# Patient Record
Sex: Female | Born: 1970 | Race: White | Hispanic: No | Marital: Married | Smoking: Former smoker
Health system: Southern US, Community
[De-identification: ages and names within clinical notes are randomized; demographics above are authoritative.]

## PROBLEM LIST (undated history)

## (undated) ENCOUNTER — Emergency Department (HOSPITAL_COMMUNITY): Admission: EM | Payer: BLUE CROSS/BLUE SHIELD | Source: Home / Self Care

## (undated) DIAGNOSIS — K219 Gastro-esophageal reflux disease without esophagitis: Secondary | ICD-10-CM

## (undated) DIAGNOSIS — T8859XA Other complications of anesthesia, initial encounter: Secondary | ICD-10-CM

## (undated) DIAGNOSIS — Z8489 Family history of other specified conditions: Secondary | ICD-10-CM

## (undated) DIAGNOSIS — J189 Pneumonia, unspecified organism: Secondary | ICD-10-CM

## (undated) DIAGNOSIS — E119 Type 2 diabetes mellitus without complications: Secondary | ICD-10-CM

## (undated) DIAGNOSIS — R112 Nausea with vomiting, unspecified: Secondary | ICD-10-CM

## (undated) DIAGNOSIS — Z8719 Personal history of other diseases of the digestive system: Secondary | ICD-10-CM

## (undated) DIAGNOSIS — Z9889 Other specified postprocedural states: Secondary | ICD-10-CM

## (undated) DIAGNOSIS — I10 Essential (primary) hypertension: Secondary | ICD-10-CM

## (undated) DIAGNOSIS — T4145XA Adverse effect of unspecified anesthetic, initial encounter: Secondary | ICD-10-CM

## (undated) HISTORY — PX: BREAST EXCISIONAL BIOPSY: SUR124

---
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
## 1999-01-22 ENCOUNTER — Encounter: Payer: Self-pay | Admitting: Obstetrics and Gynecology

## 1999-01-22 ENCOUNTER — Ambulatory Visit (HOSPITAL_COMMUNITY): Admission: RE | Admit: 1999-01-22 | Discharge: 1999-01-22 | Payer: Self-pay | Admitting: Obstetrics and Gynecology

## 1999-04-01 ENCOUNTER — Ambulatory Visit (HOSPITAL_COMMUNITY): Admission: RE | Admit: 1999-04-01 | Discharge: 1999-04-01 | Payer: Self-pay | Admitting: Obstetrics and Gynecology

## 1999-04-01 ENCOUNTER — Encounter: Payer: Self-pay | Admitting: Obstetrics and Gynecology

## 1999-07-08 ENCOUNTER — Encounter: Admission: RE | Admit: 1999-07-08 | Discharge: 1999-07-08 | Payer: Self-pay | Admitting: General Surgery

## 1999-07-08 ENCOUNTER — Encounter: Payer: Self-pay | Admitting: General Surgery

## 1999-07-17 ENCOUNTER — Other Ambulatory Visit: Admission: RE | Admit: 1999-07-17 | Discharge: 1999-07-17 | Payer: Self-pay | Admitting: General Surgery

## 2000-03-29 ENCOUNTER — Other Ambulatory Visit: Admission: RE | Admit: 2000-03-29 | Discharge: 2000-03-29 | Payer: Self-pay | Admitting: Obstetrics and Gynecology

## 2001-05-01 ENCOUNTER — Other Ambulatory Visit: Admission: RE | Admit: 2001-05-01 | Discharge: 2001-05-01 | Payer: Self-pay | Admitting: Obstetrics and Gynecology

## 2002-05-01 ENCOUNTER — Other Ambulatory Visit: Admission: RE | Admit: 2002-05-01 | Discharge: 2002-05-01 | Payer: Self-pay | Admitting: Obstetrics and Gynecology

## 2002-06-14 ENCOUNTER — Encounter: Payer: Self-pay | Admitting: Obstetrics and Gynecology

## 2002-06-14 ENCOUNTER — Ambulatory Visit (HOSPITAL_COMMUNITY): Admission: RE | Admit: 2002-06-14 | Discharge: 2002-06-14 | Payer: Self-pay | Admitting: Obstetrics and Gynecology

## 2002-08-21 ENCOUNTER — Ambulatory Visit (HOSPITAL_COMMUNITY): Admission: RE | Admit: 2002-08-21 | Discharge: 2002-08-21 | Payer: Self-pay | Admitting: Obstetrics and Gynecology

## 2002-08-21 ENCOUNTER — Encounter: Payer: Self-pay | Admitting: Obstetrics and Gynecology

## 2003-02-23 HISTORY — PX: BREAST SURGERY: SHX581

## 2003-03-19 ENCOUNTER — Encounter: Admission: RE | Admit: 2003-03-19 | Discharge: 2003-03-19 | Payer: Self-pay | Admitting: Obstetrics and Gynecology

## 2003-04-02 ENCOUNTER — Ambulatory Visit (HOSPITAL_COMMUNITY): Admission: RE | Admit: 2003-04-02 | Discharge: 2003-04-02 | Payer: Self-pay | Admitting: Obstetrics and Gynecology

## 2003-05-02 ENCOUNTER — Encounter (INDEPENDENT_AMBULATORY_CARE_PROVIDER_SITE_OTHER): Payer: Self-pay | Admitting: *Deleted

## 2003-05-02 ENCOUNTER — Inpatient Hospital Stay (HOSPITAL_COMMUNITY): Admission: AD | Admit: 2003-05-02 | Discharge: 2003-05-05 | Payer: Self-pay | Admitting: Obstetrics and Gynecology

## 2003-06-13 ENCOUNTER — Other Ambulatory Visit: Admission: RE | Admit: 2003-06-13 | Discharge: 2003-06-13 | Payer: Self-pay | Admitting: Obstetrics and Gynecology

## 2004-06-15 ENCOUNTER — Other Ambulatory Visit: Admission: RE | Admit: 2004-06-15 | Discharge: 2004-06-15 | Payer: Self-pay | Admitting: Obstetrics and Gynecology

## 2004-07-09 ENCOUNTER — Emergency Department (HOSPITAL_COMMUNITY): Admission: EM | Admit: 2004-07-09 | Discharge: 2004-07-09 | Payer: Self-pay | Admitting: Emergency Medicine

## 2004-10-13 ENCOUNTER — Encounter: Admission: RE | Admit: 2004-10-13 | Discharge: 2004-10-13 | Payer: Self-pay | Admitting: General Surgery

## 2005-02-22 HISTORY — PX: CHOLECYSTECTOMY: SHX55

## 2007-02-27 ENCOUNTER — Observation Stay (HOSPITAL_COMMUNITY): Admission: AD | Admit: 2007-02-27 | Discharge: 2007-02-28 | Payer: Self-pay | Admitting: Obstetrics and Gynecology

## 2007-03-02 ENCOUNTER — Inpatient Hospital Stay (HOSPITAL_COMMUNITY): Admission: AD | Admit: 2007-03-02 | Discharge: 2007-03-02 | Payer: Self-pay | Admitting: Obstetrics and Gynecology

## 2007-03-16 ENCOUNTER — Inpatient Hospital Stay (HOSPITAL_COMMUNITY): Admission: AD | Admit: 2007-03-16 | Discharge: 2007-03-19 | Payer: Self-pay | Admitting: Obstetrics and Gynecology

## 2007-03-16 ENCOUNTER — Encounter (INDEPENDENT_AMBULATORY_CARE_PROVIDER_SITE_OTHER): Payer: Self-pay | Admitting: Obstetrics and Gynecology

## 2007-10-03 ENCOUNTER — Encounter: Admission: RE | Admit: 2007-10-03 | Discharge: 2007-10-03 | Payer: Self-pay | Admitting: Orthopedic Surgery

## 2008-11-23 ENCOUNTER — Encounter: Admission: RE | Admit: 2008-11-23 | Discharge: 2008-11-23 | Payer: Self-pay | Admitting: Sports Medicine

## 2008-11-26 ENCOUNTER — Encounter: Admission: RE | Admit: 2008-11-26 | Discharge: 2008-11-26 | Payer: Self-pay | Admitting: Sports Medicine

## 2008-12-10 ENCOUNTER — Encounter: Admission: RE | Admit: 2008-12-10 | Discharge: 2008-12-10 | Payer: Self-pay | Admitting: Sports Medicine

## 2009-01-24 ENCOUNTER — Encounter: Admission: RE | Admit: 2009-01-24 | Discharge: 2009-01-24 | Payer: Self-pay | Admitting: Internal Medicine

## 2009-02-22 HISTORY — PX: BACK SURGERY: SHX140

## 2009-03-11 ENCOUNTER — Encounter: Admission: RE | Admit: 2009-03-11 | Discharge: 2009-03-11 | Payer: Self-pay | Admitting: Sports Medicine

## 2009-03-23 ENCOUNTER — Encounter: Admission: RE | Admit: 2009-03-23 | Discharge: 2009-03-23 | Payer: Self-pay | Admitting: Neurological Surgery

## 2010-03-15 ENCOUNTER — Encounter: Payer: Self-pay | Admitting: Obstetrics and Gynecology

## 2010-03-15 ENCOUNTER — Encounter: Payer: Self-pay | Admitting: Internal Medicine

## 2010-07-07 NOTE — Discharge Summary (Signed)
NAME:  Melanie Peters, Melanie Peters              ACCOUNT NO.:  [id number]   MEDICAL RECORD NO.:  [id number]          PATIENT TYPE:  INP   LOCATION:  9139                          FACILITY:  [facility]   PHYSICIAN:  Zenaida Niece, M.D.DATE OF BIRTH:  1970/05/13   DATE OF ADMISSION:  03/16/2007  DATE OF DISCHARGE:  03/19/2007                               DISCHARGE SUMMARY   ADMISSION DIAGNOSIS:  Intrauterine pregnancy at 37+ weeks, previous  cesarean section gestational diabetes, desires surgical sterility, in  labor with rupture of membranes.   DISCHARGE DIAGNOSES:  Intrauterine pregnancy at 37+ weeks, previous  cesarean section gestational diabetes, desires surgical sterility, in  labor with rupture of membranes.   PROCEDURES:  On January 22, she had a repeat low transverse cesarean  section and bilateral partial salpingectomy.   COMPLICATIONS:  None.   HISTORY AND PHYSICAL:  Please see chart for full history and physical,  but briefly this is a 40 year old white female gravida 3, para 1-0-1-1  with an EGA of [redacted] weeks, who presents with complaint of regular  contractions and leaking fluid.  Prenatal care complicated by  gestational diabetes, eventually controlled with 90 units of Lantus  insulin daily.  She has had reactive nonstress test.  She has also had a  prior cesarean section.   PAST MEDICAL HISTORY:  Significant for gastroesophageal reflux disorder  and remote history of seizures.   PAST SURGICAL HISTORY:  Cesarean section, surgery for TMJ, a left breast  cyst, right breast reduction, and cholecystectomy.   PHYSICAL EXAM:  VITAL SIGNS:  She is afebrile with stable vital signs.  Blood pressure is 140/85.  ABDOMEN:  Gravid, nontender with well-healed transverse scar.  Cervix is  2, 90, -2 vertex presentation, and she is grossly ruptured.   HOSPITAL COURSE:  The patient was admitted on the day of surgery and  underwent repeat low transverse cesarean section and tubal ligation  under  spinal anesthesia without complications.  Estimated blood loss was  800 mL.  The night of surgery, she had difficulty sitting up and was  unable to stand.  She was hemodynamically stable and had good urine  output.  Preoperative hemoglobin 11.4 and hemoglobin on the afternoon of  surgery 9.4, and on postpartum postoperative number #1 was 8.8.  CBGs  remained all normal, after delivery without insulin.  She was able to  ambulate on postoperative number one and had no further complications.  Hemoglobin on postoperative #2 was up to 9.6.  On postoperative #3, she  was felt to be stable enough for discharge home.   DISCHARGE INSTRUCTIONS:  Regular diet, pelvic rest, no strenuous  activity.  Followup is in 3 to 4 days for staple removal.   MEDICATIONS:  1. Percocet #40, one to two p.o. q. 4-6 hours p.r.n. pain and over-the-      counter ibuprofen as needed, and she is given our discharge      pamphlet.      Zenaida Niece, M.D.  Electronically Signed     TDM/MEDQ  D:  03/19/2007  T:  03/19/2007  Job:  [id number]

## 2010-07-07 NOTE — Op Note (Signed)
NAME:  Melanie Peters, Melanie Peters              ACCOUNT NO.:  [id number]   MEDICAL RECORD NO.:  [id number]          PATIENT TYPE:  INP   LOCATION:  9139                          [facility]ACILITY:  [facility]   PHYSICIAN:  Leighton Roach Meisinger, M.D.DATE OF BIRTH:  04-18-1970   DATE OF PROCEDURE:  03/16/2007  DATE OF DISCHARGE:                               OPERATIVE REPORT   PREOPERATIVE DIAGNOSES:  Intrauterine pregnancy at 37+ weeks, previous  cesarean section, gestational diabetes, desires surgical sterility, in  labor with rupture of membranes.   POSTOPERATIVE DIAGNOSES:  Intrauterine pregnancy at 37+ weeks, previous  cesarean section, gestational diabetes, desires surgical sterility, in  labor with rupture of membranes.   PROCEDURE:  Repeat low transverse cesarean section and bilateral partial  salpingectomy.   SURGEON:  Zenaida Niece, M.D.   ASSISTANT:  Malachi Pro. Ambrose Mantle, M.D.   ANESTHESIA:  Spinal.   FINDINGS:  She had normal gravid anatomy with normal tubes and ovaries.  She delivered a viable female infant with Apgars of 8 and 8 that weighed 8  pounds 8 ounces and had nuchal cord x2.   SPECIMENS:  Placenta sent to labor and delivery and segments of  bilateral fallopian tubes sent to pathology.   ESTIMATED BLOOD LOSS:  800 mL.   COMPLICATIONS:  None.   PROCEDURE IN DETAIL:  The patient was taken to the operating room and  placed in the sitting position.  Dr. Malen Gauze instilled spinal anesthesia.  She was then placed in the dorsal supine position with left lateral  tilt.  Abdomen was prepped and draped in the usual sterile fashion and a  Foley catheter was inserted.  The level of her anesthesia was found to  be adequate.  Abdomen was then entered via a standard Pfannenstiel  incision through her previous scar.  She was noted to have a significant  diastasis recti.  The rectus muscles were not needed to be separated at  all to enter the peritoneal cavity.  The Alexis disposable self-  retaining  retractor was placed and good visualization was achieved.  A 4  cm transverse incision was then made in the lower uterine segment,  pushing the bladder inferior.  The lower uterine segment was fairly  thick.  Once the uterine cavity was entered the incision was extended  digitally.  The fetal vertex was grasped and delivered through the  incision atraumatically.  Mouth and nares were suctioned and nuchal cord  x2 was reduced.  The remainder of the infant then delivered  atraumatically.  Cord was doubly clamped and cut and the infant handed  to the awaiting pediatric team.  Placenta was then manually removed.  Uterus was wiped dry with a clean lap pad and all clots and debris  removed.  Uterine incision was inspected and found to be free of  extensions.  The uterine incision was then closed in 1 layer, being a  running locking layer with #1 chromic with adequate hemostasis.  A  second suture from the right side was used to tie the suture that  started from the left side and came across the uterus.  Bleeding from  serosal edges was controlled with electrocautery.   Attention was turned to tubal ligation.  Both fallopian tubes were  identified and traced to their fimbriated ends.  A Babcock clamp was  used to grab the middle portion of each segment.  A window was made in  an avascular portion of the mesosalpinx.  Zero plain gut suture was  passed through this window and tied proximally and then wrapped around  distally to form a knuckle of tube.  The knuckle of tube was then  removed sharply.  On both sides both tubal ostia were identified and the  stumps were hemostatic.  Prior to making the uterine incision, a moist  lap pad had been placed into the peritoneal cavity on the right side to  prevent prolapse of bowel and omentum.  Uterine incision was again now  inspected and found to be hemostatic and this lap pad was removed.  The  disposable retractor was removed.  Subfascial space was  irrigated and  made hemostatic with electrocautery.  Rectus muscles were reapproximated  in the midline with running #1 chromic.  The fascia was then closed in a  running fashion starting at both ends and meeting in the middle with 0  Vicryl.  Subcutaneous tissue was then irrigated and made hemostatic with  electrocautery.  Subcutaneous tissue was closed with running 2-0 plain  gut suture.  Skin was then closed with staples followed by a sterile  dressing.  The patient tolerated the procedure well.  She was taken to  the recovery room in stable condition after tolerating the procedure  well.  Counts were correct and she received Ancef 1 g IV at the  beginning of the procedure.      Zenaida Niece, M.D.  Electronically Signed     TDM/MEDQ  D:  03/16/2007  T:  03/16/2007  Job:  [id number]

## 2010-07-07 NOTE — H&P (Signed)
NAME:  Melanie Peters, Melanie Peters              ACCOUNT NO.:  [id number]   MEDICAL RECORD NO.:  [id number]          PATIENT TYPE:  INP   LOCATION:  9139                          [facility]ACILITY:  [facility]   PHYSICIAN:  Malachi Pro. Ambrose Mantle, M.D. DATE OF BIRTH:  08-02-1970   DATE OF ADMISSION:  03/16/2007  DATE OF DISCHARGE:                              HISTORY & PHYSICAL   HISTORY OF PRESENT ILLNESS:  This is a 40 year old white married female,  para 1-0-1-1, gravida 3,  EDC April 02, 2007 admitted with ruptured  membranes and early labor.  Blood group and type A positive with a  negative antibody, RPR nonreactive, rubella immune, hepatitis B surface  antigen negative, HIV negative, GC and chlamydia negative.  Pap smear  normal.  Vaginal ultrasound on August 30, 2006, crown-rump length 2.53 cm,  9 weeks 2 days, [facility] April 02, 2007.  A 1-hour Glucola done at 16 weeks  was 194.  3-hour GTT fasting was 106, 1 hour 187, 2 hours 159, 3 hours  158.  AFP was negative.  First trimester screen was negative.  The  patient was begun on glyburide 2.5 mg a day in September of 2008.  The  glyburide was increased to 5 mg a day to decrease her fasting blood  sugar on January 02, 2007.  So, on January 09, 2007, the patient was  not controlled well enough with glyburide, and she was begun on Lantus  30 units every morning.  On January 16, 2007, it was increased to 40  units.  On January 23, 2007, it was increased to 45 units, and on  January 27, 2007 to 50 units.  Nonstress tests were begun twice a week.  Lantus was increased to 70 units on February 06, 2007 and was up to 80  units from February 13, 2007.  It subsequently was increased to 85 units  a day, and it seemed to be adequately controlled on that.  Her group B  strep was positive.  On March 07, 2007, the patient seemed to be well-  controlled on 90 units a day.  The patient was admitted to the hospital  because of vaginal bleeding on February 27, 2007.  The bleeding  subsided,  and she was able to be discharged on February 28, 2007.  At approximately  3:00 a.m. today, she called me stating that she had bled heavily,  passing a clot, and was still bleeding.  She came to the hospital and on  admission to the hospital actually what she showed on her pad was bloody  fluid, and rupture of membranes was confirmed.  She was contracting  every 3-4 minutes.  Her cervix was 2 cm, 90%, vertex at a -2.  The  patient did not want to have labor.   ALLERGIES:  NO KNOWN ALLERGIES.   PAST SURGICAL HISTORY:  1. She did have a C-section in March of 2005.  2. She had TMJ surgery in 1998.  3. Left breast cyst removed in 2000.  4. Right breast reduction in 1996.  5. Cholecystectomy in 2006/   PAST MEDICAL HISTORY:  1.  Gastroesophageal reflux disorder.  2. She did have seizures in the sixth grade and was on phenobarbital      for 6 years.   SOCIAL HISTORY:  No alcohol, tobacco, or drugs.   FAMILY HISTORY:  The father has high blood pressure, heart disease, and  diabetes.  Mother has lung disease.  Paternal grandfather has heart  disease and high blood pressure.  Maternal grandmother, diabetes.   PHYSICAL EXAMINATION:  GENERAL:  Well-developed obese white female  having contractions.  VITAL SIGNS:  Her blood pressure is 144/85, temperature is 97.5, pulse  67, respirations 20.  HEENT:  No cranial abnormalities.  Extraocular movements intact.  Nose  and pharynx clear.  LUNGS:  Clear to auscultation.  HEART:  Normal size and sounds.  No murmurs.  ABDOMEN:  Soft.  It is not tender.  Fundal height on March 07, 2007  was 42 cm.  Fetal heart tones are normal.  The patient is having  contractions with no decelerations.  The cervix is 2 cm, 90%, vertex at  a -2 station.  Blood sugar in the MAU was 89.   ADMITTING IMPRESSION:  1. Intrauterine pregnancy at 37+ weeks with ruptured membranes, early      labor, prior history of cesarean section, declines vaginal birth       after cesarean.  2. Gestational diabetes on insulin.   The patient is prepared for C-section.      Malachi Pro. Ambrose Mantle, M.D.  Electronically Signed     TFH/MEDQ  D:  03/16/2007  T:  03/16/2007  Job:  [id number]

## 2010-07-10 NOTE — Op Note (Signed)
NAME:  Melanie Peters, Melanie Peters                          ACCOUNT NO.:  [id number]   MEDICAL RECORD NO.:  [id number]                   PATIENT TYPE:  INP   LOCATION:  9130                                 FACILITY:  [facility]   PHYSICIAN:  Zenaida Niece, M.D.             DATE OF BIRTH:  1970-10-01   DATE OF PROCEDURE:  05/02/2003  DATE OF DISCHARGE:                                 OPERATIVE REPORT   PREOPERATIVE DIAGNOSIS:  Intrauterine pregnancy at 37 plus weeks,  gestational diabetes, arrest of dilation.   POSTOPERATIVE DIAGNOSIS:  Intrauterine pregnancy at 37 plus weeks,  gestational diabetes, arrest of dilation, myomatous uterus.   PROCEDURE:  Primary low transverse cesarean section and myomectomy.   SURGEON:  Zenaida Niece, M.D.   ANESTHESIA:  Epidural.   ESTIMATED BLOOD LOSS:  1000 mL.   FINDINGS:  The patient had normal anatomy except for a golf ball size  pedunculated anterior myoma.  She delivered a viable female infant with  Apgars 8 and 9, weight 7 pounds 8 ounces.   PROCEDURE IN DETAIL:  The patient was taken to the operating room and placed  in the dorsal supine position with a left lateral tilt.  Her previously  placed epidural was dosed appropriately.  Her abdomen was prepped and draped  in the usual sterile fashion.  A Foley catheter had previously been placed.  The level of her anesthesia was found to be adequate and her abdomen was  entered via a standard Pfannenstiel incision.  The bladder blade was placed  and the lower uterine segment was incised in a transverse fashion and the  bladder pushed inferior.  The uterine incision was extended digitally.  The  fetal vertex was grasped and delivered through the incision atraumatically.  The mouth and nares were suctioned.  The remainder of the infant delivered  atraumatically.  The cord was doubly clamped and cut and the infant handed  to the awaiting pediatric team.  Cord blood and cord gas were obtained.  The  placenta  manually removed.  The uterus was exteriorized to get good  visualization of the incision as it appeared to be bleeding briskly.  Several bleeding areas were controlled with ring forceps.  The incision was  without extensions.  The uterus was then replaced into the abdominal cavity.  The uterine incision was closed in one layer being a running locking layer  with #1 chromic with adequate hemostasis.  Both tubes and ovaries were  normal.  The anterior pedunculated myoma was located.  Verbal consent was  obtained from the patient to remove this.  The base of the fibroid was tied  with a #1 chromic free tie.  The myoma was then removed with electrocautery  and the stump was hemostatic.  The uterine incision was again inspected and  found to be hemostatic.  Bleeding from the serosal edges was controlled with  electrocautery.  The subfascial space  was irrigated and made hemostatic with  electrocautery.  The fascia was closed in a running fashion starting at both  ends and meeting in the middle with 0 Vicryl.  The subcutaneous tissue was  irrigated and made hemostatic with electrocautery.  The  subcutaneous tissue was closed with running 2-0 plain gut suture.  The skin  was closed with staples and a sterile dressing.  The patient tolerated the  procedure well and was taken to the recovery room in stable condition.  Counts were correct x 2.  She was given Ancef 1 gram after cord clamp.                                               Zenaida Niece, M.D.    TDM/MEDQ  D:  05/02/2003  T:  05/04/2003  Job:  [id number]

## 2010-07-10 NOTE — Discharge Summary (Signed)
NAME:  Melanie Peters, Melanie Peters                          ACCOUNT NO.:  [id number]   MEDICAL RECORD NO.:  [id number]                   PATIENT TYPE:  INP   LOCATION:  9130                                 [facility]ACILITY:  [facility]   PHYSICIAN:  Malachi Pro. Ambrose Mantle, M.D.              DATE OF BIRTH:  Mar 06, 1970   DATE OF ADMISSION:  05/02/2003  DATE OF DISCHARGE:  05/05/2003                                 DISCHARGE SUMMARY   HISTORY OF PRESENT ILLNESS:  A 40 year old white female para 0-0-1-0 gravida  2, 37+ weeks gestation, presented to labor and delivery with spontaneous  rupture of membranes at 1 a.m. on May 02, 2003. Clear fluid. No  significant contractions. Prenatal care complicated by gestational diabetes,  controlled on Glyburide 5 mg twice a day. Also followed with non-stress test  two times weekly. Otherwise, history of anxiety that was stable off  medications and placenta previa by ultrasound, which resolved. Blood group  and type A+, negative antibody, non-reactive serology, Rubella immune,  hepatitis B surface antigen negative. HIV negative. GC and Chlamydia  negative. Triple screen normal. Group B strep negative. One hour Glucola  178. Three hour GTT fasting 121. One hour 243. Two hour 226 and 3 hours 196.   PAST OBSTETRIC HISTORY:  Early abortion x1.   GYNECOLOGIC HISTORY:  Negative.   PAST SURGICAL HISTORY:  Breast reduction in 1996. TMJ surgery in 1988.  Lumpectomy in 2000.   PAST MEDICAL HISTORY:  Anxiety, history of seizures in the 6th grade that  resolved.   ALLERGIES:  No known drug allergies.   MEDICATIONS:  Glyburide 5 mg twice a day.   PHYSICAL EXAMINATION:  VITAL SIGNS:  The patient was afebrile with normal  vital signs on admission.  HEART:  Normal.  LUNGS:  Normal.  ABDOMEN:  Soft and gravid.  PELVIC:  Cervix 1 cm, 50%, grossly ruptured membranes. Clear fluid.   HOSPITAL COURSE:  The patient became uncomfortable after starting Pitocin.  By 8:10 a.m., the cervix was 3  cm. By 12:35 p.m., she was 8 cm. By 5:05  p.m., she was still 8 cm. She underwent a low transverse cervical cesarean  section by Dr. Jackelyn Knife with myomectomy because of arrest of dilatation.  The myomectomy was done because of a pedunculated myoma. Procedure done  under epidural block. Blood loss about 1,000 cc. There was a golf ball size  pedunculated anterior myoma. The infant was a 7 pound 8 ounce female with  Apgar's of 8 at 1 and 9 at 5 minutes. Postoperatively the patient did well.  She did continue to have somewhat elevated blood sugars. Her 1 fasting blood  sugar was 136. Her 1 hour blood sugars were 179, 172. On the third  postoperative day, she was afebrile, ambulating well without difficulty,  passing flatus, voiding well and was ready for discharge.   LABORATORY DATA:  Initial hemoglobin 11.3, hematocrit 34.1. White count  11,700. Platelet count 299,000. Followup hemoglobin 9.9. Hematocrit 29.8.  RPR was non-reactive.   FINAL DIAGNOSES:  1. Intrauterine pregnancy at 37+ weeks with premature rupture of the     membranes.  2. Delivered vertex by cesarean section.  3. Arrest of dilatation.  4. Leiomyomata uteri.   OPERATION:  Low transverse Cesarean section and myomectomy.   CONDITION ON DISCHARGE:  Improved.   INSTRUCTIONS:  Include our regular discharge instructions. She is to  continue to take her fasting and 1 hour blood sugars and call with fasting  blood sugars greater than 126 and 1 hour blood sugars greater than 200.   FOLLOW UP:  She is to return to the office in 10 to 14 days for followup  examination. Her staples have been removed and strips applied. At the  postpartum visits, if her blood sugars remain normal, that she probable  should be checked for diabetes with a glucose tolerance test.   DISCHARGE MEDICATIONS:  Percocet 5/325 20 tablets, 1 every 4 to 6 hours as  needed for pain is given at discharge.                                                Malachi Pro. Ambrose Mantle, M.D.    TFH/MEDQ  D:  05/05/2003  T:  05/06/2003  Job:  [id number]

## 2010-11-11 LAB — COMPREHENSIVE METABOLIC PANEL
ALT: 23
AST: 22
Albumin: 2.4 — ABNORMAL LOW
BUN: 8
Calcium: 8.3 — ABNORMAL LOW
Chloride: 102
Creatinine, Ser: 0.75
Glucose, Bld: 87
Sodium: 134 — ABNORMAL LOW
Total Protein: 5.9 — ABNORMAL LOW

## 2010-11-11 LAB — CBC
HCT: 32.7 — ABNORMAL LOW
Hemoglobin: 11.2 — ABNORMAL LOW
MCHC: 34.2
Platelets: 271
RBC: 3.89
WBC: 11.6 — ABNORMAL HIGH

## 2010-11-11 LAB — LACTATE DEHYDROGENASE: LDH: 136

## 2010-11-12 LAB — CBC
HCT: 26.1 — ABNORMAL LOW
MCHC: 33.6
MCHC: 33.7
MCV: 82.8
Platelets: 232
Platelets: 318
RBC: 3.15 — ABNORMAL LOW
RBC: 3.41 — ABNORMAL LOW
RDW: 14.5
WBC: 10.6 — ABNORMAL HIGH
WBC: 12.2 — ABNORMAL HIGH

## 2010-11-12 LAB — HEMOGLOBIN AND HEMATOCRIT, BLOOD
HCT: 27.6 — ABNORMAL LOW
Hemoglobin: 9.4 — ABNORMAL LOW

## 2010-11-12 LAB — SAMPLE TO BLOOD BANK

## 2010-11-12 LAB — RPR: RPR Ser Ql: NONREACTIVE

## 2010-12-21 ENCOUNTER — Other Ambulatory Visit (HOSPITAL_COMMUNITY): Payer: Self-pay | Admitting: Obstetrics and Gynecology

## 2010-12-21 DIAGNOSIS — Z1231 Encounter for screening mammogram for malignant neoplasm of breast: Secondary | ICD-10-CM

## 2011-01-19 ENCOUNTER — Ambulatory Visit (HOSPITAL_COMMUNITY)
Admission: RE | Admit: 2011-01-19 | Discharge: 2011-01-19 | Disposition: A | Discharge status: Home / Self Care | Payer: BC Managed Care – PPO | Source: Ambulatory Visit | Attending: Obstetrics and Gynecology | Admitting: Obstetrics and Gynecology

## 2011-01-19 DIAGNOSIS — Z1231 Encounter for screening mammogram for malignant neoplasm of breast: Secondary | ICD-10-CM | POA: Insufficient documentation

## 2011-12-07 ENCOUNTER — Ambulatory Visit
Admission: RE | Admit: 2011-12-07 | Discharge: 2011-12-07 | Disposition: A | Discharge status: Home / Self Care | Payer: BC Managed Care – PPO | Source: Ambulatory Visit | Attending: Chiropractor | Admitting: Chiropractor

## 2011-12-07 ENCOUNTER — Other Ambulatory Visit: Payer: Self-pay | Admitting: Chiropractor

## 2011-12-07 ENCOUNTER — Other Ambulatory Visit: Payer: Self-pay | Admitting: Obstetrics and Gynecology

## 2011-12-07 DIAGNOSIS — M545 Low back pain, unspecified: Secondary | ICD-10-CM

## 2011-12-07 DIAGNOSIS — Z1231 Encounter for screening mammogram for malignant neoplasm of breast: Secondary | ICD-10-CM

## 2012-01-19 ENCOUNTER — Other Ambulatory Visit: Payer: Self-pay | Admitting: Chiropractic Medicine

## 2012-01-19 DIAGNOSIS — M545 Low back pain, unspecified: Secondary | ICD-10-CM

## 2012-01-24 ENCOUNTER — Ambulatory Visit (HOSPITAL_COMMUNITY)
Admission: RE | Admit: 2012-01-24 | Discharge: 2012-01-24 | Disposition: A | Discharge status: Home / Self Care | Payer: BC Managed Care – PPO | Source: Ambulatory Visit | Attending: Obstetrics and Gynecology | Admitting: Obstetrics and Gynecology

## 2012-01-24 DIAGNOSIS — Z1231 Encounter for screening mammogram for malignant neoplasm of breast: Secondary | ICD-10-CM | POA: Insufficient documentation

## 2012-01-26 ENCOUNTER — Ambulatory Visit
Admission: RE | Admit: 2012-01-26 | Discharge: 2012-01-26 | Disposition: A | Discharge status: Home / Self Care | Payer: BC Managed Care – PPO | Source: Ambulatory Visit | Attending: Chiropractor | Admitting: Chiropractor

## 2012-01-26 DIAGNOSIS — M545 Low back pain, unspecified: Secondary | ICD-10-CM

## 2012-11-10 ENCOUNTER — Other Ambulatory Visit: Payer: Self-pay | Admitting: Neurological Surgery

## 2012-11-10 DIAGNOSIS — M545 Low back pain, unspecified: Secondary | ICD-10-CM

## 2012-11-22 ENCOUNTER — Other Ambulatory Visit: Payer: BC Managed Care – PPO

## 2012-12-07 ENCOUNTER — Ambulatory Visit
Admission: RE | Admit: 2012-12-07 | Discharge: 2012-12-07 | Disposition: A | Discharge status: Home / Self Care | Payer: BC Managed Care – PPO | Source: Ambulatory Visit | Attending: Neurological Surgery | Admitting: Neurological Surgery

## 2012-12-07 DIAGNOSIS — M545 Low back pain, unspecified: Secondary | ICD-10-CM

## 2012-12-07 MED ORDER — GADOBENATE DIMEGLUMINE 529 MG/ML IV SOLN
20.0000 mL | Freq: Once | INTRAVENOUS | Status: AC | PRN
  Administered 2012-12-07: 20 mL via INTRAVENOUS

## 2013-12-27 ENCOUNTER — Other Ambulatory Visit: Payer: Self-pay | Admitting: Neurological Surgery

## 2013-12-27 DIAGNOSIS — M545 Low back pain, unspecified: Secondary | ICD-10-CM

## 2014-01-04 ENCOUNTER — Ambulatory Visit
Admission: RE | Admit: 2014-01-04 | Discharge: 2014-01-04 | Disposition: A | Discharge status: Home / Self Care | Payer: BC Managed Care – PPO | Source: Ambulatory Visit | Attending: Neurological Surgery | Admitting: Neurological Surgery

## 2014-01-04 DIAGNOSIS — M545 Low back pain, unspecified: Secondary | ICD-10-CM

## 2014-03-01 DIAGNOSIS — J189 Pneumonia, unspecified organism: Secondary | ICD-10-CM

## 2014-03-01 HISTORY — DX: Pneumonia, unspecified organism: J18.9

## 2014-03-04 ENCOUNTER — Other Ambulatory Visit: Payer: Self-pay | Admitting: Neurological Surgery

## 2014-03-25 NOTE — Pre-Procedure Instructions (Signed)
Melanie Peters  03/25/2014   Your procedure is scheduled on:  Wednesday, February 10.  [facility].  Call this number if you have problems the morning of surgery: [phone]              For any other questions, please call [phone], Monday - Friday 8 AM - 4 PM.  Remember:   Do not eat food or drink liquids after midnight Tuesday, February 9.   Take these medicines the morning of surgery with A SIP OF WATER: esomeprazole (NEXIUM).              Stop taking ibuprofen (ADVIL,MOTRIN).    Do not wear jewelry, make-up or nail polish.  Do not wear lotions, powders, or perfumes.   Do not shave 48 hours prior to surgery.  Do not bring valuables to the hospital.              [facility] any belongings or valuables.               Contacts, dentures or bridgework may not be worn into surgery.  Leave suitcase in the car. After surgery it may be brought to your room.  For patients admitted to the hospital, discharge time is determined by your treatment team.                 Special Instructions: Review  [facility] - Preparing For Surgery.   Please read over the following fact sheets that you were given: Pain Booklet, Coughing and Deep Breathing, Blood Transfusion Information and Surgical Site Infection Prevention

## 2014-03-26 ENCOUNTER — Inpatient Hospital Stay (HOSPITAL_COMMUNITY)
Admission: RE | Admit: 2014-03-26 | Discharge: 2014-03-26 | Disposition: A | Discharge status: Home / Self Care | Payer: Self-pay | Source: Ambulatory Visit

## 2014-03-27 ENCOUNTER — Encounter (HOSPITAL_COMMUNITY)
Admission: RE | Admit: 2014-03-27 | Discharge: 2014-03-27 | Disposition: A | Discharge status: Home / Self Care | Payer: BLUE CROSS/BLUE SHIELD | Source: Ambulatory Visit | Attending: Neurological Surgery | Admitting: Neurological Surgery

## 2014-03-27 ENCOUNTER — Ambulatory Visit (HOSPITAL_COMMUNITY)
Admission: RE | Admit: 2014-03-27 | Discharge: 2014-03-27 | Disposition: A | Discharge status: Home / Self Care | Payer: BLUE CROSS/BLUE SHIELD | Source: Ambulatory Visit | Attending: Neurological Surgery | Admitting: Neurological Surgery

## 2014-03-27 ENCOUNTER — Encounter (HOSPITAL_COMMUNITY): Payer: Self-pay

## 2014-03-27 DIAGNOSIS — Z01810 Encounter for preprocedural cardiovascular examination: Secondary | ICD-10-CM | POA: Insufficient documentation

## 2014-03-27 DIAGNOSIS — M48061 Spinal stenosis, lumbar region without neurogenic claudication: Secondary | ICD-10-CM

## 2014-03-27 DIAGNOSIS — M5136 Other intervertebral disc degeneration, lumbar region: Secondary | ICD-10-CM

## 2014-03-27 DIAGNOSIS — M4806 Spinal stenosis, lumbar region: Secondary | ICD-10-CM | POA: Diagnosis not present

## 2014-03-27 DIAGNOSIS — Z01812 Encounter for preprocedural laboratory examination: Secondary | ICD-10-CM | POA: Insufficient documentation

## 2014-03-27 DIAGNOSIS — Z01818 Encounter for other preprocedural examination: Secondary | ICD-10-CM | POA: Insufficient documentation

## 2014-03-27 HISTORY — DX: Pneumonia, unspecified organism: J18.9

## 2014-03-27 HISTORY — DX: Adverse effect of unspecified anesthetic, initial encounter: T41.45XA

## 2014-03-27 HISTORY — DX: Other specified postprocedural states: Z98.890

## 2014-03-27 HISTORY — DX: Gastro-esophageal reflux disease without esophagitis: K21.9

## 2014-03-27 HISTORY — DX: Personal history of other diseases of the digestive system: Z87.19

## 2014-03-27 HISTORY — DX: Nausea with vomiting, unspecified: R11.2

## 2014-03-27 HISTORY — DX: Essential (primary) hypertension: I10

## 2014-03-27 HISTORY — DX: Other complications of anesthesia, initial encounter: T88.59XA

## 2014-03-27 HISTORY — DX: Type 2 diabetes mellitus without complications: E11.9

## 2014-03-27 HISTORY — DX: Family history of other specified conditions: Z84.89

## 2014-03-27 LAB — BASIC METABOLIC PANEL
Anion gap: 9 (ref 5–15)
BUN: 15 mg/dL (ref 6–23)
CALCIUM: 9 mg/dL (ref 8.4–10.5)
CO2: 25 mmol/L (ref 19–32)
Chloride: 103 mmol/L (ref 96–112)
Creatinine, Ser: 0.74 mg/dL (ref 0.50–1.10)
GFR calc non Af Amer: >90 mL/min (ref >90)
Glucose, Bld: 172 mg/dL — ABNORMAL HIGH (ref 70–99)
POTASSIUM: 3.4 mmol/L — AB (ref 3.5–5.1)
Sodium: 137 mmol/L (ref 135–145)

## 2014-03-27 LAB — PROTIME-INR
INR: 1.01 (ref 0.00–1.49)
Prothrombin Time: 13.4 seconds (ref 11.6–15.2)

## 2014-03-27 LAB — CBC WITH DIFFERENTIAL/PLATELET
BASOS ABS: 0 10*3/uL (ref 0.0–0.1)
BASOS PCT: 0 % (ref 0–1)
EOS ABS: 0.1 10*3/uL (ref 0.0–0.7)
EOS PCT: 1 % (ref 0–5)
HEMATOCRIT: 37.5 % (ref 36.0–46.0)
Hemoglobin: 12.4 g/dL (ref 12.0–15.0)
LYMPHS PCT: 28 % (ref 12–46)
Lymphs Abs: 2.2 10*3/uL (ref 0.7–4.0)
MCH: 27.6 pg (ref 26.0–34.0)
MCHC: 33.1 g/dL (ref 30.0–36.0)
MCV: 83.3 fL (ref 78.0–100.0)
MONO ABS: 0.5 10*3/uL (ref 0.1–1.0)
Monocytes Relative: 6 % (ref 3–12)
NEUTROS PCT: 65 % (ref 43–77)
Neutro Abs: 4.9 10*3/uL (ref 1.7–7.7)
Platelets: 289 10*3/uL (ref 150–400)
RBC: 4.5 MIL/uL (ref 3.87–5.11)
RDW: 13.7 % (ref 11.5–15.5)
WBC: 7.7 10*3/uL (ref 4.0–10.5)

## 2014-03-27 LAB — SURGICAL PCR SCREEN
MRSA, PCR: NEGATIVE
Staphylococcus aureus: NEGATIVE

## 2014-03-27 LAB — TYPE AND SCREEN
ABO/RH(D): A POS
ANTIBODY SCREEN: NEGATIVE

## 2014-03-27 LAB — GLUCOSE, CAPILLARY: GLUCOSE-CAPILLARY: 172 mg/dL — AB (ref 70–99)

## 2014-03-27 LAB — ABO/RH: ABO/RH(D): A POS

## 2014-03-27 LAB — HCG, SERUM, QUALITATIVE: Preg, Serum: NEGATIVE

## 2014-04-02 MED ORDER — DEXAMETHASONE SODIUM PHOSPHATE 10 MG/ML IJ SOLN
10.0000 mg | INTRAMUSCULAR | Status: AC
  Administered 2014-04-03: 10 mg via INTRAVENOUS
  Filled 2014-04-02: qty 1

## 2014-04-02 MED ORDER — CEFAZOLIN SODIUM-DEXTROSE 2-3 GM-% IV SOLR
2.0000 g | INTRAVENOUS | Status: AC
  Administered 2014-04-03: 2 g via INTRAVENOUS
  Filled 2014-04-02: qty 50

## 2014-04-03 ENCOUNTER — Inpatient Hospital Stay (HOSPITAL_COMMUNITY)
Admission: RE | Admit: 2014-04-03 | Discharge: 2014-04-05 | DRG: 460 | Disposition: A | Discharge status: Home / Self Care | Payer: BLUE CROSS/BLUE SHIELD | Source: Ambulatory Visit | Attending: Neurological Surgery | Admitting: Neurological Surgery

## 2014-04-03 ENCOUNTER — Encounter (HOSPITAL_COMMUNITY): Payer: Self-pay | Admitting: Anesthesiology

## 2014-04-03 ENCOUNTER — Inpatient Hospital Stay (HOSPITAL_COMMUNITY): Payer: BLUE CROSS/BLUE SHIELD

## 2014-04-03 ENCOUNTER — Inpatient Hospital Stay (HOSPITAL_COMMUNITY): Payer: BLUE CROSS/BLUE SHIELD | Admitting: Anesthesiology

## 2014-04-03 ENCOUNTER — Encounter (HOSPITAL_COMMUNITY)
Admission: RE | Disposition: A | Discharge status: Home / Self Care | Payer: BLUE CROSS/BLUE SHIELD | Source: Ambulatory Visit | Attending: Neurological Surgery

## 2014-04-03 DIAGNOSIS — E119 Type 2 diabetes mellitus without complications: Secondary | ICD-10-CM | POA: Diagnosis present

## 2014-04-03 DIAGNOSIS — Z87891 Personal history of nicotine dependence: Secondary | ICD-10-CM | POA: Diagnosis not present

## 2014-04-03 DIAGNOSIS — Z791 Long term (current) use of non-steroidal anti-inflammatories (NSAID): Secondary | ICD-10-CM

## 2014-04-03 DIAGNOSIS — Z79899 Other long term (current) drug therapy: Secondary | ICD-10-CM | POA: Diagnosis not present

## 2014-04-03 DIAGNOSIS — M47816 Spondylosis without myelopathy or radiculopathy, lumbar region: Secondary | ICD-10-CM | POA: Diagnosis present

## 2014-04-03 DIAGNOSIS — Z793 Long term (current) use of hormonal contraceptives: Secondary | ICD-10-CM | POA: Diagnosis not present

## 2014-04-03 DIAGNOSIS — M4806 Spinal stenosis, lumbar region: Secondary | ICD-10-CM | POA: Diagnosis present

## 2014-04-03 DIAGNOSIS — Z981 Arthrodesis status: Secondary | ICD-10-CM

## 2014-04-03 DIAGNOSIS — Z419 Encounter for procedure for purposes other than remedying health state, unspecified: Secondary | ICD-10-CM

## 2014-04-03 DIAGNOSIS — I10 Essential (primary) hypertension: Secondary | ICD-10-CM | POA: Diagnosis present

## 2014-04-03 DIAGNOSIS — M5137 Other intervertebral disc degeneration, lumbosacral region: Secondary | ICD-10-CM | POA: Diagnosis present

## 2014-04-03 DIAGNOSIS — K219 Gastro-esophageal reflux disease without esophagitis: Secondary | ICD-10-CM | POA: Diagnosis present

## 2014-04-03 DIAGNOSIS — M5136 Other intervertebral disc degeneration, lumbar region: Secondary | ICD-10-CM | POA: Diagnosis present

## 2014-04-03 DIAGNOSIS — M549 Dorsalgia, unspecified: Secondary | ICD-10-CM | POA: Diagnosis present

## 2014-04-03 HISTORY — PX: MAXIMUM ACCESS (MAS)POSTERIOR LUMBAR INTERBODY FUSION (PLIF) 2 LEVEL: SHX6369

## 2014-04-03 LAB — GLUCOSE, CAPILLARY
GLUCOSE-CAPILLARY: 163 mg/dL — AB (ref 70–99)
Glucose-Capillary: 136 mg/dL — ABNORMAL HIGH (ref 70–99)
Glucose-Capillary: 181 mg/dL — ABNORMAL HIGH (ref 70–99)

## 2014-04-03 SURGERY — FOR MAXIMUM ACCESS (MAS) POSTERIOR LUMBAR INTERBODY FUSION (PLIF) 2 LEVEL
Anesthesia: General | Site: Back

## 2014-04-03 MED ORDER — NORETHINDRONE 0.35 MG PO TABS
1.0000 | ORAL_TABLET | Freq: Every day | ORAL | Status: DC
  Administered 2014-04-03: 0.35 mg via ORAL

## 2014-04-03 MED ORDER — ARTIFICIAL TEARS OP OINT
TOPICAL_OINTMENT | OPHTHALMIC | Status: AC
  Filled 2014-04-03: qty 3.5

## 2014-04-03 MED ORDER — ROCURONIUM BROMIDE 50 MG/5ML IV SOLN
INTRAVENOUS | Status: AC
  Filled 2014-04-03: qty 1

## 2014-04-03 MED ORDER — PANTOPRAZOLE SODIUM 40 MG PO TBEC
40.0000 mg | DELAYED_RELEASE_TABLET | Freq: Every day | ORAL | Status: DC
  Administered 2014-04-03 – 2014-04-05 (×3): 40 mg via ORAL
  Filled 2014-04-03 (×3): qty 1

## 2014-04-03 MED ORDER — STERILE WATER FOR INJECTION IJ SOLN
INTRAMUSCULAR | Status: AC
  Filled 2014-04-03: qty 10

## 2014-04-03 MED ORDER — FENTANYL CITRATE 0.05 MG/ML IJ SOLN
INTRAMUSCULAR | Status: AC
  Filled 2014-04-03: qty 5

## 2014-04-03 MED ORDER — MENTHOL 3 MG MT LOZG: 1.0000 | LOZENGE | OROMUCOSAL | Status: DC | PRN

## 2014-04-03 MED ORDER — BACITRACIN 50000 UNITS IM SOLR
INTRAMUSCULAR | Status: DC | PRN
  Administered 2014-04-03: 08:00:00

## 2014-04-03 MED ORDER — SODIUM CHLORIDE 0.9 % IJ SOLN: 3.0000 mL | INTRAMUSCULAR | Status: DC | PRN

## 2014-04-03 MED ORDER — HYDROMORPHONE HCL 1 MG/ML IJ SOLN
0.2500 mg | INTRAMUSCULAR | Status: DC | PRN
  Administered 2014-04-03 (×4): 0.5 mg via INTRAVENOUS

## 2014-04-03 MED ORDER — POTASSIUM CHLORIDE IN NACL 20-0.9 MEQ/L-% IV SOLN
INTRAVENOUS | Status: DC
  Filled 2014-04-03 (×5): qty 1000

## 2014-04-03 MED ORDER — SUCCINYLCHOLINE CHLORIDE 20 MG/ML IJ SOLN
INTRAMUSCULAR | Status: AC
  Filled 2014-04-03: qty 1

## 2014-04-03 MED ORDER — SCOPOLAMINE 1 MG/3DAYS TD PT72
MEDICATED_PATCH | TRANSDERMAL | Status: AC
  Administered 2014-04-03: 1 via TRANSDERMAL
  Filled 2014-04-03: qty 1

## 2014-04-03 MED ORDER — PROPOFOL 10 MG/ML IV BOLUS
INTRAVENOUS | Status: DC | PRN
  Administered 2014-04-03: 200 mg via INTRAVENOUS

## 2014-04-03 MED ORDER — ONDANSETRON HCL 4 MG/2ML IJ SOLN
INTRAMUSCULAR | Status: AC
  Filled 2014-04-03: qty 2

## 2014-04-03 MED ORDER — THROMBIN 5000 UNITS EX SOLR
OROMUCOSAL | Status: DC | PRN
  Administered 2014-04-03: 08:00:00 via TOPICAL

## 2014-04-03 MED ORDER — SURGIFOAM 100 EX MISC
CUTANEOUS | Status: DC | PRN
  Administered 2014-04-03: 08:00:00 via TOPICAL

## 2014-04-03 MED ORDER — ACETAMINOPHEN 650 MG RE SUPP: 650.0000 mg | RECTAL | Status: DC | PRN

## 2014-04-03 MED ORDER — CELECOXIB 200 MG PO CAPS
200.0000 mg | ORAL_CAPSULE | Freq: Two times a day (BID) | ORAL | Status: DC
  Administered 2014-04-03 – 2014-04-05 (×5): 200 mg via ORAL
  Filled 2014-04-03 (×6): qty 1

## 2014-04-03 MED ORDER — FENTANYL CITRATE 0.05 MG/ML IJ SOLN
INTRAMUSCULAR | Status: DC | PRN
  Administered 2014-04-03: 100 ug via INTRAVENOUS
  Administered 2014-04-03 (×2): 50 ug via INTRAVENOUS
  Administered 2014-04-03: 100 ug via INTRAVENOUS
  Administered 2014-04-03: 50 ug via INTRAVENOUS
  Administered 2014-04-03: 100 ug via INTRAVENOUS
  Administered 2014-04-03: 50 ug via INTRAVENOUS

## 2014-04-03 MED ORDER — LIDOCAINE HCL (CARDIAC) 20 MG/ML IV SOLN
INTRAVENOUS | Status: AC
  Filled 2014-04-03: qty 10

## 2014-04-03 MED ORDER — MIDAZOLAM HCL 2 MG/2ML IJ SOLN
INTRAMUSCULAR | Status: AC
  Filled 2014-04-03: qty 2

## 2014-04-03 MED ORDER — DIPHENHYDRAMINE HCL 50 MG/ML IJ SOLN
INTRAMUSCULAR | Status: DC | PRN
Start: 2014-04-03 — End: 2014-04-03
  Administered 2014-04-03: 12.5 mg via INTRAVENOUS

## 2014-04-03 MED ORDER — ONDANSETRON HCL 4 MG/2ML IJ SOLN: 4.0000 mg | INTRAMUSCULAR | Status: DC | PRN

## 2014-04-03 MED ORDER — ACETAMINOPHEN 325 MG PO TABS: 650.0000 mg | ORAL_TABLET | ORAL | Status: DC | PRN

## 2014-04-03 MED ORDER — EPHEDRINE SULFATE 50 MG/ML IJ SOLN
INTRAMUSCULAR | Status: AC
  Filled 2014-04-03: qty 1

## 2014-04-03 MED ORDER — METHOCARBAMOL 500 MG PO TABS
500.0000 mg | ORAL_TABLET | Freq: Four times a day (QID) | ORAL | Status: DC | PRN
  Administered 2014-04-03 – 2014-04-05 (×6): 500 mg via ORAL
  Filled 2014-04-03 (×5): qty 1

## 2014-04-03 MED ORDER — METFORMIN HCL 500 MG PO TABS
500.0000 mg | ORAL_TABLET | Freq: Two times a day (BID) | ORAL | Status: DC
  Administered 2014-04-03 – 2014-04-05 (×4): 500 mg via ORAL
  Filled 2014-04-03 (×6): qty 1

## 2014-04-03 MED ORDER — CEFAZOLIN SODIUM 1-5 GM-% IV SOLN
1.0000 g | Freq: Three times a day (TID) | INTRAVENOUS | Status: AC
  Administered 2014-04-03 (×2): 1 g via INTRAVENOUS
  Filled 2014-04-03 (×2): qty 50

## 2014-04-03 MED ORDER — 0.9 % SODIUM CHLORIDE (POUR BTL) OPTIME
TOPICAL | Status: DC | PRN
  Administered 2014-04-03: 1000 mL

## 2014-04-03 MED ORDER — DEXTROSE 5 % IV SOLN: 500.0000 mg | Freq: Four times a day (QID) | INTRAVENOUS | Status: DC | PRN

## 2014-04-03 MED ORDER — PROPOFOL INFUSION 10 MG/ML OPTIME
INTRAVENOUS | Status: DC | PRN
  Administered 2014-04-03: 12:00:00 via INTRAVENOUS
  Administered 2014-04-03: 40 ug/kg/min via INTRAVENOUS

## 2014-04-03 MED ORDER — DIPHENHYDRAMINE HCL 50 MG/ML IJ SOLN
INTRAMUSCULAR | Status: AC
  Filled 2014-04-03: qty 1

## 2014-04-03 MED ORDER — HYDROMORPHONE HCL 1 MG/ML IJ SOLN: 0.2500 mg | INTRAMUSCULAR | Status: DC | PRN

## 2014-04-03 MED ORDER — PROPOFOL 10 MG/ML IV BOLUS
INTRAVENOUS | Status: AC
  Filled 2014-04-03: qty 20

## 2014-04-03 MED ORDER — MORPHINE SULFATE 2 MG/ML IJ SOLN: 1.0000 mg | INTRAMUSCULAR | Status: DC | PRN

## 2014-04-03 MED ORDER — DEXAMETHASONE 4 MG PO TABS
4.0000 mg | ORAL_TABLET | Freq: Four times a day (QID) | ORAL | Status: DC
  Administered 2014-04-03 – 2014-04-04 (×3): 4 mg via ORAL
  Filled 2014-04-03 (×7): qty 1

## 2014-04-03 MED ORDER — HYDROMORPHONE HCL 1 MG/ML IJ SOLN
INTRAMUSCULAR | Status: AC
  Filled 2014-04-03: qty 1

## 2014-04-03 MED ORDER — ACETAMINOPHEN 10 MG/ML IV SOLN
INTRAVENOUS | Status: AC
  Administered 2014-04-03: 1000 mg via INTRAVENOUS
  Filled 2014-04-03: qty 100

## 2014-04-03 MED ORDER — ONDANSETRON HCL 4 MG/2ML IJ SOLN: 4.0000 mg | Freq: Once | INTRAMUSCULAR | Status: DC | PRN

## 2014-04-03 MED ORDER — OXYCODONE-ACETAMINOPHEN 5-325 MG PO TABS
1.0000 | ORAL_TABLET | ORAL | Status: DC | PRN
  Administered 2014-04-03 – 2014-04-05 (×9): 2 via ORAL
  Filled 2014-04-03 (×9): qty 2

## 2014-04-03 MED ORDER — BUPIVACAINE HCL (PF) 0.25 % IJ SOLN
INTRAMUSCULAR | Status: DC | PRN
Start: 2014-04-03 — End: 2014-04-03
  Administered 2014-04-03: 4 mL

## 2014-04-03 MED ORDER — DEXAMETHASONE SODIUM PHOSPHATE 4 MG/ML IJ SOLN
4.0000 mg | Freq: Four times a day (QID) | INTRAMUSCULAR | Status: DC
  Filled 2014-04-03 (×4): qty 1

## 2014-04-03 MED ORDER — SUCCINYLCHOLINE CHLORIDE 20 MG/ML IJ SOLN
INTRAMUSCULAR | Status: DC | PRN
  Administered 2014-04-03: 120 mg via INTRAVENOUS

## 2014-04-03 MED ORDER — ONDANSETRON HCL 4 MG/2ML IJ SOLN
INTRAMUSCULAR | Status: DC | PRN
  Administered 2014-04-03 (×2): 4 mg via INTRAVENOUS

## 2014-04-03 MED ORDER — PHENOL 1.4 % MT LIQD: 1.0000 | OROMUCOSAL | Status: DC | PRN

## 2014-04-03 MED ORDER — LACTATED RINGERS IV SOLN
INTRAVENOUS | Status: DC | PRN
  Administered 2014-04-03 (×3): via INTRAVENOUS

## 2014-04-03 MED ORDER — LIDOCAINE HCL (CARDIAC) 20 MG/ML IV SOLN
INTRAVENOUS | Status: DC | PRN
  Administered 2014-04-03: 50 mg via INTRAVENOUS
  Administered 2014-04-03: 40 mg via INTRAVENOUS
  Administered 2014-04-03: 50 mg via INTRATRACHEAL

## 2014-04-03 MED ORDER — MIDAZOLAM HCL 5 MG/5ML IJ SOLN
INTRAMUSCULAR | Status: DC | PRN
  Administered 2014-04-03: 2 mg via INTRAVENOUS

## 2014-04-03 MED ORDER — SODIUM CHLORIDE 0.9 % IV SOLN: 250.0000 mL | INTRAVENOUS | Status: DC

## 2014-04-03 MED ORDER — HYDROCHLOROTHIAZIDE 25 MG PO TABS
25.0000 mg | ORAL_TABLET | Freq: Every day | ORAL | Status: DC
  Administered 2014-04-03 – 2014-04-05 (×3): 25 mg via ORAL
  Filled 2014-04-03 (×3): qty 1

## 2014-04-03 MED ORDER — SODIUM CHLORIDE 0.9 % IJ SOLN
3.0000 mL | Freq: Two times a day (BID) | INTRAMUSCULAR | Status: DC
  Administered 2014-04-03: 3 mL via INTRAVENOUS

## 2014-04-03 SURGICAL SUPPLY — 67 items
APL SKNCLS STERI-STRIP NONHPOA (GAUZE/BANDAGES/DRESSINGS) ×1
BAG DECANTER FOR FLEXI CONT (MISCELLANEOUS) ×2 IMPLANT
BENZOIN TINCTURE PRP APPL 2/3 (GAUZE/BANDAGES/DRESSINGS) ×2 IMPLANT
BLADE CLIPPER SURG (BLADE) IMPLANT
BONE MATRIX OSTEOCEL PRO MED (Bone Implant) ×2 IMPLANT
BUR MATCHSTICK NEURO 3.0 LAGG (BURR) ×2 IMPLANT
CAGE COROENT MP 8X9X23M-8 SPIN (Cage) ×8 IMPLANT
CANISTER SUCT 3000ML (MISCELLANEOUS) ×2 IMPLANT
CLIP NEUROVISION LG (CLIP) ×2 IMPLANT
CONT SPEC 4OZ CLIKSEAL STRL BL (MISCELLANEOUS) ×4 IMPLANT
COVER BACK TABLE 24X17X13 BIG (DRAPES) IMPLANT
COVER BACK TABLE 60X90IN (DRAPES) ×2 IMPLANT
DRAPE C-ARM 42X72 X-RAY (DRAPES) ×2 IMPLANT
DRAPE C-ARMOR (DRAPES) ×2 IMPLANT
DRAPE LAPAROTOMY 100X72X124 (DRAPES) ×2 IMPLANT
DRAPE POUCH INSTRU U-SHP 10X18 (DRAPES) ×2 IMPLANT
DRAPE SURG 17X23 STRL (DRAPES) ×2 IMPLANT
DRSG OPSITE 4X5.5 SM (GAUZE/BANDAGES/DRESSINGS) ×2 IMPLANT
DRSG OPSITE POSTOP 4X6 (GAUZE/BANDAGES/DRESSINGS) ×1 IMPLANT
DRSG TELFA 3X8 NADH (GAUZE/BANDAGES/DRESSINGS) ×2 IMPLANT
DURAPREP 26ML APPLICATOR (WOUND CARE) ×2 IMPLANT
ELECT BLADE 4.0 EZ CLEAN MEGAD (MISCELLANEOUS) ×2
ELECT REM PT RETURN 9FT ADLT (ELECTROSURGICAL) ×2
ELECTRODE BLDE 4.0 EZ CLN MEGD (MISCELLANEOUS) IMPLANT
ELECTRODE REM PT RTRN 9FT ADLT (ELECTROSURGICAL) ×1 IMPLANT
EVACUATOR 1/8 PVC DRAIN (DRAIN) ×2 IMPLANT
GAUZE SPONGE 4X4 16PLY XRAY LF (GAUZE/BANDAGES/DRESSINGS) IMPLANT
GLOVE BIO SURGEON STRL SZ8 (GLOVE) ×4 IMPLANT
GLOVE ECLIPSE 6.5 STRL STRAW (GLOVE) ×2 IMPLANT
GLOVE INDICATOR 7.5 STRL GRN (GLOVE) ×8 IMPLANT
GLOVE SURG SS PI 7.0 STRL IVOR (GLOVE) ×3 IMPLANT
GOWN STRL REUS W/ TWL LRG LVL3 (GOWN DISPOSABLE) ×1 IMPLANT
GOWN STRL REUS W/ TWL XL LVL3 (GOWN DISPOSABLE) ×2 IMPLANT
GOWN STRL REUS W/TWL 2XL LVL3 (GOWN DISPOSABLE) IMPLANT
GOWN STRL REUS W/TWL LRG LVL3 (GOWN DISPOSABLE) ×2
GOWN STRL REUS W/TWL XL LVL3 (GOWN DISPOSABLE) ×6
HEMOSTAT POWDER KIT SURGIFOAM (HEMOSTASIS) ×1 IMPLANT
KIT BASIN OR (CUSTOM PROCEDURE TRAY) ×2 IMPLANT
KIT NEEDLE NVM5 EMG ELECT (KITS) ×1 IMPLANT
KIT NEEDLE NVM5 EMG ELECTRODE (KITS) ×1
KIT ROOM TURNOVER OR (KITS) ×2 IMPLANT
MILL MEDIUM DISP (BLADE) ×2 IMPLANT
NEEDLE HYPO 25X1 1.5 SAFETY (NEEDLE) ×2 IMPLANT
NS IRRIG 1000ML POUR BTL (IV SOLUTION) ×2 IMPLANT
PACK LAMINECTOMY NEURO (CUSTOM PROCEDURE TRAY) ×2 IMPLANT
PAD ARMBOARD 7.5X6 YLW CONV (MISCELLANEOUS) ×6 IMPLANT
PAD DRESSING TELFA 3X8 NADH (GAUZE/BANDAGES/DRESSINGS) ×1 IMPLANT
ROD PLIF MAS 65MM (Rod) ×4 IMPLANT
SCREW LOCK (Screw) ×12 IMPLANT
SCREW LOCK FXNS SPNE MAS PL (Screw) ×6 IMPLANT
SCREW PLIF MAS 5.0X35 (Screw) ×2 IMPLANT
SCREW SHANK 5.0X30MM (Screw) ×2 IMPLANT
SCREW SHANK 6.5X65 (Screw) ×4 IMPLANT
SCREW TULIP 5.5 (Screw) ×8 IMPLANT
SPONGE LAP 4X18 X RAY DECT (DISPOSABLE) IMPLANT
SPONGE SURGIFOAM ABS GEL 100 (HEMOSTASIS) ×2 IMPLANT
STRIP CLOSURE SKIN 1/2X4 (GAUZE/BANDAGES/DRESSINGS) ×2 IMPLANT
SUT VIC AB 0 CT1 18XCR BRD8 (SUTURE) ×1 IMPLANT
SUT VIC AB 0 CT1 8-18 (SUTURE) ×6
SUT VIC AB 2-0 CP2 18 (SUTURE) ×4 IMPLANT
SUT VIC AB 3-0 SH 8-18 (SUTURE) ×4 IMPLANT
SYR 20ML ECCENTRIC (SYRINGE) ×2 IMPLANT
SYR 3ML LL SCALE MARK (SYRINGE) IMPLANT
TOWEL OR 17X24 6PK STRL BLUE (TOWEL DISPOSABLE) ×2 IMPLANT
TOWEL OR 17X26 10 PK STRL BLUE (TOWEL DISPOSABLE) ×2 IMPLANT
TRAY FOLEY CATH 14FRSI W/METER (CATHETERS) ×2 IMPLANT
WATER STERILE IRR 1000ML POUR (IV SOLUTION) ×2 IMPLANT

## 2014-04-03 NOTE — Anesthesia Postprocedure Evaluation (Signed)
  Anesthesia Post-op Note  Patient: Melanie Peters  Procedure(s) Performed: Procedure(s): FOR MAXIMUM ACCESS SURGERY (MAS) POSTERIOR LUMBAR INTERBODY FUSION LUMBAR FOUR - FIVE TO LUMBAR FIVE- SACRAL ONE (N/A)  Patient Location: PACU  Anesthesia Type:General  Level of Consciousness: awake, alert , oriented and patient cooperative  Airway and Oxygen Therapy: Patient Spontanous Breathing  Post-op Pain: mild  Post-op Assessment: Post-op Vital signs reviewed, Patient's Cardiovascular Status Stable, Respiratory Function Stable, Patent Airway, No signs of Nausea or vomiting and Pain level controlled  Post-op Vital Signs: stable  Last Vitals:  Filed Vitals:   04/03/14 1321  BP: 131/65  Pulse: 88  Temp:   Resp: 13    Complications: No apparent anesthesia complications

## 2014-04-03 NOTE — Evaluation (Addendum)
Occupational Therapy Evaluation Patient Details Name: Melanie Peters MRN: [id number] DOB: 1971/01/28 Today's Date: 04/03/2014    History of Present Illness 44 y.o. s/p MAXIMUM ACCESS SURGERY (MAS) POSTERIOR LUMBAR INTERBODY FUSION LUMBAR FOUR - FIVE TO LUMBAR FIVE- SACRAL ONE   Clinical Impression   Pt s/p above. Pt independent with ADLs, PTA. Feel pt will benefit from acute OT to increase independence with BADLs, prior to d/c. Plan to practice with AE for LB ADLs next session.    Follow Up Recommendations  No OT follow up;Supervision - Intermittent    Equipment Recommendations  Other (comment) (AE)    Recommendations for Other Services       Precautions / Restrictions Precautions Precautions: Back Precaution Booklet Issued: Yes (comment)-wrote additional information on sheet Precaution Comments: educated on precautions Required Braces or Orthoses: Spinal Brace Spinal Brace: Lumbar corset;Applied in sitting position Restrictions Weight Bearing Restrictions: No      Mobility Bed Mobility Overal bed mobility: Needs Assistance Bed Mobility: Rolling;Sidelying to Sit;Sit to Sidelying Rolling: Modified independent (Device/Increase time);Supervision Sidelying to sit: Supervision     Sit to sidelying: Supervision General bed mobility comments: cues for technique.  Transfers Overall transfer level: Needs assistance   Transfers: Sit to/from Stand Sit to Stand: Min guard         General transfer comment: cues for technique.    Balance  No LOB in session-Slightly unsteady with ambulation Min guard assist.                                          ADL Overall ADL's : Needs assistance/impaired     Grooming: Wash/dry hands;Set up;Supervision/safety;Standing (also fixed hair)           Upper Body Dressing : Set up;Supervision/safety;Sitting (adjusted brace standing)   Lower Body Dressing: Minimal assistance;With adaptive equipment;Sit to/from  stand   Toilet Transfer: Min guard;Ambulation;Comfort height toilet;Grab bars   Toileting- Clothing Manipulation and Hygiene: Maximal assistance;Sit to/from stand  comments: assist with pad/panties and to clean pt more thoroughly; also pt unable to reach behind without twisting.     Functional mobility during ADLs: Min guard General ADL Comments: Educated on AE/dressing technique. Educated on safety. Educated on back brace as well as incorporating precautions into functional activities. Educated on positioning of pillows and discussed positioning in chair as she was asking about recliner. Recommended spouse be with her for shower transfer.  Educated on positioning of grooming items and use of cups for oral care to prevent breaking precautions. Educated on toilet aide and what pt could use for this at home. Explained that is is beneficial to be moving/walking. Recommended not to sit in chair longer than one hour. Pt practiced with sockaid.      Vision     Perception     Praxis      Pertinent Vitals/Pain Pain Assessment: 0-10 Pain Score: 6  Pain Location: back Pain Intervention(s): Monitored during session     Hand Dominance Right   Extremity/Trunk Assessment Upper Extremity Assessment Upper Extremity Assessment: Overall WFL for tasks assessed   Lower Extremity Assessment Lower Extremity Assessment: Overall WFL for tasks assessed       Communication Communication Communication: No difficulties   Cognition Arousal/Alertness: Awake/alert Behavior During Therapy: WFL for tasks assessed/performed Overall Cognitive Status: Within Functional Limits for tasks assessed  General Comments       Exercises       Shoulder Instructions      Home Living Family/patient expects to be discharged to:: Private residence Living Arrangements: Spouse/significant other;Children Available Help at Discharge: Family;Available 24 hours/day Type of Home:  House Home Access: Stairs to enter [employer] of Steps: 5 and 4  Entrance Stairs-Rails:  (one set has rail on right and other has rails on both) Home Layout: One level     Bathroom Shower/Tub: Chief Strategy Officer: Standard (counter close)     Home Equipment:  (access to equipment)          Prior Functioning/Environment Level of Independence: Independent             OT Diagnosis: Acute pain   OT Problem List: Decreased range of motion;Impaired balance (sitting and/or standing);Decreased knowledge of use of DME or AE;Decreased knowledge of precautions;Pain   OT Treatment/Interventions: Self-care/ADL training;DME and/or AE instruction;Therapeutic activities;Patient/family education;Balance training    OT Goals(Current goals can be found in the care plan section) Acute Rehab OT Goals Patient Stated Goal: not stated OT Goal Formulation: With patient Time For Goal Achievement: 04/10/14 Potential to Achieve Goals: Good ADL Goals Pt Will Perform Grooming: with modified independence;standing Pt Will Perform Lower Body Bathing: with modified independence;with adaptive equipment;sit to/from stand Pt Will Perform Lower Body Dressing: with modified independence;with adaptive equipment;sit to/from stand Pt Will Transfer to Toilet: with modified independence;ambulating Pt Will Perform Toileting - Clothing Manipulation and hygiene: with modified independence;sit to/from stand Additional ADL Goal #1: Pt will independently verbalize and demonstrate 3/3 precautions.  OT Frequency: Min 2X/week   Barriers to D/C:            Co-evaluation              End of Session Equipment Utilized During Treatment: Gait belt;Back brace Nurse Communication: Mobility status  Activity Tolerance: Patient tolerated treatment well Patient left: in bed;with family/visitor present   Time: 1710-1746 OT Time Calculation (min): 36 min Charges:  OT General Charges $OT  Visit: 1 Procedure OT Evaluation $Initial OT Evaluation Tier I: 1 Procedure OT Treatments $Self Care/Home Management : 8-22 mins G-CodesEarlie Raveling OTR/L [phone] 04/03/2014, 6:30 PM

## 2014-04-03 NOTE — Care Management (Signed)
Utilization Review completed   Karolyn Messing,RN, BSN,CCM 

## 2014-04-03 NOTE — Op Note (Signed)
04/03/2014  12:52 PM  PATIENT:  Melanie Peters  44 y.o. 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 Decompressive lumbar laminectomy L4-5 L5-S1 requiring more work than would be required for a simple exposure of the disk for PLIF in order to adequately decompress the neural elements and address the spinal stenosis 2. Posterior lumbar interbody fusion L4-5 L5-S1 using PEEK interbody cages packed with morcellized allograft and autograft 3. Posterior fixation L4-S1 inclusive using cortical pedicle screws.    SURGEON:  Marikay Alar, MD  ASSISTANTS: Dr. Franky Macho  ANESTHESIA:  General  EBL: 200 ml  Total I/O In: 2000 [I.V.:2000] Out: 380 [Urine:180; Blood:200]  BLOOD ADMINISTERED:none  DRAINS: Hemovac   INDICATION FOR PROCEDURE: This patient underwent a previous right L5-S1 microdiscectomy by another surgeon. She presented with a long history of back and right leg pain. She tried medical management for quite some time without relief. MRI showed severe degenerative disc disease with recurrent disc herniation and stenosis L4-5 L5-S1. I recommended decompression and instrumented fusion. Patient understood the risks, benefits, and alternatives and potential outcomes and wished to proceed.  PROCEDURE DETAILS:  The patient was brought to the operating room. After induction of generalized endotracheal anesthesia the patient was rolled into the prone position on chest rolls and all pressure points were padded. The patient's lumbar region was cleaned and then prepped with DuraPrep and draped in the usual sterile fashion. Anesthesia was injected and then a dorsal midline incision was made and carried down to the lumbosacral fascia. The fascia was opened and the paraspinous musculature was taken down in a subperiosteal  fashion to expose L4-5 and L5-S1. A self-retaining retractor was placed. Intraoperative fluoroscopy confirmed my level, and I started with placement of the L4 cortical pedicle screws. The pedicle screw entry zones were identified utilizing surface landmarks and  AP and lateral fluoroscopy. I scored the cortex with the high-speed drill and then used the hand drill and EMG monitoring to drill an upward and outward direction into the pedicle. I then tapped line to line, and the tap was also monitored. I then placed a 5-0 x 30 mm cortical pedicle screw into the pedicles of L4 bilaterally. I then turned my attention to the decompression and the spinous process was removed and complete lumbar laminectomies, hemi- facetectomies, and foraminotomies were performed at L45 and L5-S1. The patient had significant spinal stenosis and this required more work than would be required for a simple exposure of the disc for posterior lumbar interbody fusion. Much more generous decompression was undertaken in order to adequately decompress the neural elements and address the patient's leg pain. The yellow ligament was removed to expose the underlying dura and nerve roots, and generous foraminotomies were performed to adequately decompress the neural elements. Both the exiting and traversing nerve roots were decompressed on both sides until a coronary dilator passed easily along the nerve roots. Once the decompression was complete, I turned my attention to the posterior lower lumbar interbody fusion. The epidural venous vasculature was coagulated and cut sharply. Disc space was incised and the initial discectomy was performed with pituitary rongeurs. The disc space was distracted with sequential distractors to a height of 8 mm at each level. We then used a series of scrapers and shavers to prepare the endplates for fusion. The midline was prepared with Epstein curettes. Once  the complete discectomy was finished, we packed an appropriate  sized peek interbody cage with local autograft and morcellized allograft, gently retracted the nerve root, and tapped the cage into position at L4-5 and L5-S1.  The midline between the cages was packed with morselized autograft and allograft. We then turned our attention to the placement of the lower pedicle screws. The pedicle screw entry zones were identified utilizing surface landmarks and fluoroscopy. I drilled into each pedicle utilizing the hand drill and EMG monitoring, and tapped each pedicle with the appropriate tap. We palpated with a ball probe to assure no break in the cortex. We then placed 5-0 x 35 mm cortical pedicle screws into the pedicles bilaterally at L5 and a 6 5 x 35 mm pedicle screws at S1. . We then placed lordotic rods into the multiaxial screw heads of the pedicle screws and locked these in position with the locking caps and anti-torque device. We then checked our construct with AP and lateral fluoroscopy. Irrigated with copious amounts of bacitracin-containing saline solution. Placed a medium Hemovac drain through separate stab incision. Inspected the nerve roots once again to assure adequate decompression, lined to the dura with Gelfoam, and closed the muscle and the fascia with 0 Vicryl. Closed the subcutaneous tissues with 2-0 Vicryl and subcuticular tissues with 3-0 Vicryl. The skin was closed with benzoin and Steri-Strips. Dressing was then applied, the patient was awakened from general anesthesia and transported to the recovery room in stable condition. At the end of the procedure all sponge, needle and instrument counts were correct.   PLAN OF CARE: Admit to inpatient   PATIENT DISPOSITION:  PACU - hemodynamically stable.   Delay start of Pharmacological VTE agent (>24hrs) due to surgical blood loss or risk of bleeding:  yes

## 2014-04-03 NOTE — Transfer of Care (Signed)
Immediate Anesthesia Transfer of Care Note  Patient: Melanie Peters  Procedure(s) Performed: Procedure(s): FOR MAXIMUM ACCESS SURGERY (MAS) POSTERIOR LUMBAR INTERBODY FUSION LUMBAR FOUR - FIVE TO LUMBAR FIVE- SACRAL ONE (N/A)  Patient Location: PACU  Anesthesia Type:General  Level of Consciousness: awake, alert  and oriented  Airway & Oxygen Therapy: Patient Spontanous Breathing and Patient connected to nasal cannula oxygen  Post-op Assessment: Report given to RN, Post -op Vital signs reviewed and stable and Patient moving all extremities X 4  Post vital signs: Reviewed and stable  Last Vitals:  Filed Vitals:   04/03/14 1251  BP: 140/73  Pulse:   Temp: 36.8 C  Resp:     Complications: No apparent anesthesia complications

## 2014-04-03 NOTE — H&P (Signed)
Subjective: Patient is a 44 y.o. female admitted for PLIF. Onset of symptoms was several months ago, gradually worsening since that time.  The pain is rated severe, and is located at the across the lower back and radiates to legs. The pain is described as aching and burning and occurs intermittently. The symptoms have been progressive. Symptoms are exacerbated by exercise. MRI or CT showed DDD/ stenosis.   Past Medical History  Diagnosis Date  . Complication of anesthesia   . PONV (postoperative nausea and vomiting)   . Family history of adverse reaction to anesthesia     mother N/V  . Hypertension   . Pneumonia 03/01/14  . Diabetes mellitus without complication     on meds  . GERD (gastroesophageal reflux disease)   . History of hiatal hernia     Past Surgical History  Procedure Laterality Date  . Mandible fracture surgery Bilateral 1988  . Cholecystectomy  2007  . Cesarean section  2005, 2009  . Breast surgery  2005    cyst removal breast x2  . Back surgery  2011    lumbar    Prior to Admission medications   Medication Sig Start Date End Date Taking? Authorizing Provider  esomeprazole (NEXIUM) 40 MG capsule Take 40 mg by mouth daily at 12 noon.   Yes Historical Provider, MD  hydrochlorothiazide (HYDRODIURIL) 25 MG tablet Take 25 mg by mouth daily.   Yes Historical Provider, MD  ibuprofen (ADVIL,MOTRIN) 200 MG tablet Take 800 mg by mouth every 6 (six) hours as needed for mild pain or moderate pain.   Yes Historical Provider, MD  metFORMIN (GLUCOPHAGE) 500 MG tablet Take 500 mg by mouth 2 (two) times daily with a meal.   Yes Historical Provider, MD  norethindrone (MICRONOR,CAMILA,ERRIN) 0.35 MG tablet Take 1 tablet by mouth at bedtime.   Yes Historical Provider, MD   No Known Allergies  History  Substance Use Topics  . Smoking status: Former Smoker -- 0.25 packs/day for 15 years    Types: Cigarettes    Quit date: 03/28/2003  . Smokeless tobacco: Never Used  . Alcohol Use: Yes      Comment: social    History reviewed. No pertinent family history.   Review of Systems  Positive ROS: neg  All other systems have been reviewed and were otherwise negative with the exception of those mentioned in the HPI and as above.  Objective: Vital signs in last 24 hours: Temp:  [97.5 F (36.4 C)] 97.5 F (36.4 C) (02/10 0725) Pulse Rate:  [71] 71 (02/10 0725) Resp:  [20] 20 (02/10 0725) BP: (125)/(61) 125/61 mmHg (02/10 0725) SpO2:  [97 %] 97 % (02/10 0725) Weight:  [248 lb (112.492 kg)] 248 lb (112.492 kg) (02/10 0725)  General Appearance: Alert, cooperative, no distress, appears stated age Head: Normocephalic, without obvious abnormality, atraumatic Eyes: PERRL, conjunctiva/corneas clear, EOM's intact    Neck: Supple, symmetrical, trachea midline Back: Symmetric, no curvature, ROM normal, no CVA tenderness Lungs:  respirations unlabored Heart: Regular rate and rhythm Abdomen: Soft, non-tender Extremities: Extremities normal, atraumatic, no cyanosis or edema Pulses: 2+ and symmetric all extremities Skin: Skin color, texture, turgor normal, no rashes or lesions  NEUROLOGIC:   Mental status: Alert and oriented x4,  no aphasia, good attention span, fund of knowledge, and memory Motor Exam - grossly normal Sensory Exam - grossly normal Reflexes: 1+ Coordination - grossly normal Gait - grossly normal Balance - grossly normal Cranial Nerves: I: smell Not tested  II:  visual acuity  OS: nl    OD: nl  II: visual fields Full to confrontation  II: pupils Equal, round, reactive to light  III,VII: ptosis None  III,IV,VI: extraocular muscles  Full ROM  V: mastication Normal  V: facial light touch sensation  Normal  V,VII: corneal reflex  Present  VII: facial muscle function - upper  Normal  VII: facial muscle function - lower Normal  VIII: hearing Not tested  IX: soft palate elevation  Normal  IX,X: gag reflex Present  XI: trapezius strength  5/5  XI:  sternocleidomastoid strength 5/5  XI: neck flexion strength  5/5  XII: tongue strength  Normal    Data Review Lab Results  Component Value Date   WBC 7.7 03/27/2014   HGB 12.4 03/27/2014   HCT 37.5 03/27/2014   MCV 83.3 03/27/2014   PLT 289 03/27/2014   Lab Results  Component Value Date   NA 137 03/27/2014   K 3.4* 03/27/2014   CL 103 03/27/2014   CO2 25 03/27/2014   BUN 15 03/27/2014   CREATININE 0.74 03/27/2014   GLUCOSE 172* 03/27/2014   Lab Results  Component Value Date   INR 1.01 03/27/2014    Assessment/Plan: Patient admitted for PLIF. Patient has failed a reasonable attempt at conservative therapy.  I explained the condition and procedure to the patient and answered any questions.  Patient wishes to proceed with procedure as planned. Understands risks/ benefits and typical outcomes of procedure.   Radford Pease S 04/03/2014 8:20 AM

## 2014-04-03 NOTE — Anesthesia Preprocedure Evaluation (Signed)
Anesthesia Evaluation  Patient identified by MRN, date of birth, ID band Patient awake    Reviewed: Allergy & Precautions, NPO status , Patient's Chart, lab work & pertinent test results  History of Anesthesia Complications (+) PONV  Airway        Dental   Pulmonary former smoker,          Cardiovascular hypertension,     Neuro/Psych    GI/Hepatic hiatal hernia, GERD-  ,  Endo/Other  diabetes, Type 2, Oral Hypoglycemic Agents  Renal/GU      Musculoskeletal   Abdominal   Peds  Hematology   Anesthesia Other Findings   Reproductive/Obstetrics                             Anesthesia Physical Anesthesia Plan  ASA: II  Anesthesia Plan: General   Post-op Pain Management:    Induction: Intravenous  Airway Management Planned: Oral ETT  Additional Equipment:   Intra-op Plan:   Post-operative Plan: Extubation in OR  Informed Consent: I have reviewed the patients History and Physical, chart, labs and discussed the procedure including the risks, benefits and alternatives for the proposed anesthesia with the patient or authorized representative who has indicated his/her understanding and acceptance.     Plan Discussed with: CRNA, Anesthesiologist and Surgeon  Anesthesia Plan Comments:         Anesthesia Quick Evaluation

## 2014-04-03 NOTE — Anesthesia Procedure Notes (Signed)
Procedure Name: Intubation Date/Time: 04/03/2014 8:57 AM Performed by: [place] Pink Pre-anesthesia Checklist: Patient identified, Emergency Drugs available, Suction available, Patient being monitored and Timeout performed Patient Re-evaluated:Patient Re-evaluated prior to inductionOxygen Delivery Method: Circle system utilized Preoxygenation: Pre-oxygenation with 100% oxygen Intubation Type: IV induction Ventilation: Mask ventilation without difficulty Laryngoscope Size: Mac and 3 Grade View: Grade I Tube type: Oral Tube size: 7.0 mm Number of attempts: 1 Airway Equipment and Method: Stylet and LTA kit utilized Placement Confirmation: ETT inserted through vocal cords under direct vision,  positive ETCO2 and breath sounds checked- equal and bilateral Secured at: 21 cm Tube secured with: Tape Dental Injury: Teeth and Oropharynx as per pre-operative assessment

## 2014-04-04 LAB — GLUCOSE, CAPILLARY
GLUCOSE-CAPILLARY: 161 mg/dL — AB (ref 70–99)
GLUCOSE-CAPILLARY: 178 mg/dL — AB (ref 70–99)
GLUCOSE-CAPILLARY: 149 mg/dL — AB (ref 70–99)
GLUCOSE-CAPILLARY: 197 mg/dL — AB (ref 70–99)

## 2014-04-04 MED FILL — Sodium Chloride IV Soln 0.9%: INTRAVENOUS | Qty: 1000 | Status: AC

## 2014-04-04 MED FILL — Heparin Sodium (Porcine) Inj 1000 Unit/ML: INTRAMUSCULAR | Qty: 30 | Status: AC

## 2014-04-04 NOTE — Progress Notes (Signed)
Occupational Therapy Treatment Patient Details Name: Melanie Peters MRN: [id number] DOB: 04-02-1970 Today's Date: 04/04/2014    History of present illness 44 y.o. s/p MAXIMUM ACCESS SURGERY (MAS) POSTERIOR LUMBAR INTERBODY FUSION LUMBAR FOUR - FIVE TO LUMBAR FIVE- SACRAL ONE   OT comments  All education completed regarding compensatory techniques and use of AE/DME for ADL.Pt verbalized/demonstrated understanding.  Pt ready to D/C home when medically stable. OT signing off.   Follow Up Recommendations  No OT follow up;Supervision - Intermittent    Equipment Recommendations  None recommended by OT (AE only)    Recommendations for Other Services      Precautions / Restrictions Precautions Precautions: Back Precaution Comments: Pt able to recall and adhere to 3/3 precautions Required Braces or Orthoses: Spinal Brace Spinal Brace: Lumbar corset;Applied in sitting position Restrictions Weight Bearing Restrictions: No       Mobility Bed Mobility Overal bed mobility: Modified Independent Bed Mobility: Supine to Sit   Sidelying to sit: Modified independent (Device/Increase time) Supine to sit: Modified independent (Device/Increase time)     General bed mobility comments: Adheres to precautions using log roll technique from flat bed without rails without cues  Transfers Overall transfer level: Modified independent Equipment used: None Transfers: Sit to/from Stand Sit to Stand: Modified independent (Device/Increase time)              Balance Overall balance assessment: No apparent balance deficits (not formally assessed)                                 ADL                                         General ADL Comments: Completed education on compensatory techniques for ADL, in addition to using AE for LB ADL. Educated pt on safe tub transfer technique and home safety to reduce risk of falls. Pt able to perform ADL @ mod I level with AE.  Husband/family will assist as needed.                                       Cognition   Behavior During Therapy: WFL for tasks assessed/performed Overall Cognitive Status: Within Functional Limits for tasks assessed                                   General Comments  Pt very appreciative of information    Pertinent Vitals/ Pain       Pain Assessment: 0-10 Pain Score: 5  Pain Location: back Pain Descriptors / Indicators: Aching Pain Intervention(s): Limited activity within patient's tolerance;Monitored during session;RN gave pain meds during session  Home Living Family/patient expects to be discharged to:: Private residence Living Arrangements: Spouse/significant other;Children Available Help at Discharge: Family;Available 24 hours/day Type of Home: House Home Access: Stairs to enter [employer] of Steps: 4 Entrance Stairs-Rails: Right;Left Home Layout: One level                          Prior Functioning/Environment Level of Independence: Independent            Frequency  Progress Toward Goals  OT Goals(current goals can now be found in the care plan section)  Progress towards OT goals: Goals met/education completed, patient discharged from OT  Acute Rehab OT Goals Patient Stated Goal: to get better OT Goal Formulation: With patient Time For Goal Achievement: 04/10/14 Potential to Achieve Goals: Good ADL Goals Pt Will Perform Grooming: with modified independence;standing Pt Will Perform Lower Body Bathing: with modified independence;with adaptive equipment;sit to/from stand Pt Will Perform Lower Body Dressing: with modified independence;with adaptive equipment;sit to/from stand Pt Will Transfer to Toilet: with modified independence;ambulating Pt Will Perform Toileting - Clothing Manipulation and hygiene: with modified independence;sit to/from stand Additional ADL Goal #1: Pt will independently verbalize and  demonstrate 3/3 precautions.  Plan Discharge plan remains appropriate;All goals met and education completed, patient discharged from OT services    Co-evaluation                 End of Session     Activity Tolerance Patient tolerated treatment well   Patient Left in bed;with call bell/phone within reach;with family/visitor present   Nurse Communication Mobility status        Time: 0272-5366 OT Time Calculation (min): 28 min  Charges: OT General Charges $OT Visit: 1 Procedure OT Treatments $Self Care/Home Management : 23-37 mins  Akiyah Eppolito,HILLARY 04/04/2014, 9:37 AM   Luisa Dago, OTR/L  [phone] 04/04/2014

## 2014-04-04 NOTE — Progress Notes (Signed)
Subjective: Patient reports appropriate back pain. No numbness tingling or weakness. The preoperative right leg pain is now very intermittent.  Objective: Vital signs in last 24 hours: Temp:  [97.5 F (36.4 C)-98.3 F (36.8 C)] 98.3 F (36.8 C) (02/11 1210) Pulse Rate:  [55-66] 59 (02/11 1210) Resp:  [18-20] 20 (02/11 1210) BP: (116-138)/(64-76) 138/75 mmHg (02/11 1210) SpO2:  [96 %-100 %] 96 % (02/11 1210)  Intake/Output from previous day: 02/10 0730 - 02/11 0729 In: 2810 [P.O.:360; I.V.:2000] Out: 380 [Urine:180; Blood:200] Intake/Output this shift: Total I/O In: 600 [P.O.:600] Out: -   Neurologic: Grossly normal  Lab Results: Lab Results  Component Value Date   WBC 7.7 03/27/2014   HGB 12.4 03/27/2014   HCT 37.5 03/27/2014   MCV 83.3 03/27/2014   PLT 289 03/27/2014   Lab Results  Component Value Date   INR 1.01 03/27/2014   BMET Lab Results  Component Value Date   NA 137 03/27/2014   K 3.4* 03/27/2014   CL 103 03/27/2014   CO2 25 03/27/2014   GLUCOSE 172* 03/27/2014   BUN 15 03/27/2014   CREATININE 0.74 03/27/2014   CALCIUM 9.0 03/27/2014    Studies/Results: Dg Lumbar Spine 2-3 Views  04/03/2014   CLINICAL DATA:  Lumbar disc protrusion.  EXAM: LUMBAR SPINE - 2-3 VIEW; DG C-ARM 61-120 MIN  COMPARISON:  MRI dated 01/04/2014  FINDINGS: AP and lateral C-arm images demonstrate the patient has undergone interbody and posterior fusion at L4-5 and L5-S1.  IMPRESSION: Lumbar fusions performed at L4-5 and L5-S1.  Alignment is anatomic.   Electronically Signed   By: Francene BoyersJames  Maxwell M.D.   On: 04/03/2014 13:07   Dg C-arm 1-60 Min  04/03/2014   CLINICAL DATA:  Lumbar disc protrusion.  EXAM: LUMBAR SPINE - 2-3 VIEW; DG C-ARM 61-120 MIN  COMPARISON:  MRI dated 01/04/2014  FINDINGS: AP and lateral C-arm images demonstrate the patient has undergone interbody and posterior fusion at L4-5 and L5-S1.  IMPRESSION: Lumbar fusions performed at L4-5 and L5-S1.  Alignment is  anatomic.   Electronically Signed   By: Francene BoyersJames  Maxwell M.D.   On: 04/03/2014 13:07    Assessment/Plan: Doing very well. Likely home tomorrow.   LOS: 1 day    Malayjah Otoole S 04/04/2014, 3:21 PM

## 2014-04-04 NOTE — Evaluation (Signed)
Physical Therapy Evaluation Patient Details Name: Melanie Peters MRN: [id number] DOB: [birth year] Today's Date: 04/04/2014   History of Present Illness  44 y.o. s/p MAXIMUM ACCESS SURGERY (MAS) POSTERIOR LUMBAR INTERBODY FUSION LUMBAR FOUR - FIVE TO LUMBAR FIVE- SACRAL ONE  Clinical Impression  Pt presents with the below listed impairments and will benefit from continued PT services prior to d/c to address functional gait and stairs to prepare for d/c home with husband. Recommending OPPT to address higher level functional impairments and overall endurance/strengthening. Overall pt doing very well with mobility. Education provided on continued mobility in the home at d/c and modifications for sleeping.    Follow Up Recommendations Outpatient PT;Supervision - Intermittent    Equipment Recommendations  None recommended by PT    Recommendations for Other Services       Precautions / Restrictions Precautions Precautions: Back Precaution Comments: Pt able to recall and adhere to 3/3 precautions Required Braces or Orthoses: Spinal Brace Spinal Brace: Lumbar corset;Applied in sitting position Restrictions Weight Bearing Restrictions: No      Mobility  Bed Mobility Overal bed mobility: Modified Independent Bed Mobility: Supine to Sit   Sidelying to sit: Modified independent (Device/Increase time) Supine to sit: Modified independent (Device/Increase time)     General bed mobility comments: Adheres to precautions using log roll technique from flat bed without rails without cues  Transfers Overall transfer level: Modified independent Equipment used: None Transfers: Sit to/from Stand Sit to Stand: Modified independent (Device/Increase time)            Ambulation/Gait Ambulation/Gait assistance: Supervision Ambulation Distance (Feet): 400 Feet Assistive device: None Gait Pattern/deviations: Step-through pattern;Decreased stride length Gait velocity: decreased    General  Gait Details: maintains very rigid posture as to not twist or turn. Pt also reports head turns or sudden movements make her feel a ilttle dizzy after she has taken pain medication   Stairs Stairs: Yes Stairs assistance: Min guard Stair Management: One rail Right;Step to pattern Number of Stairs: 8 General stair comments: steady A progressing to supervision. Educated on technique.  Wheelchair Mobility    Modified Rankin (Stroke Patients Only)       Balance Overall balance assessment: No apparent balance deficits (not formally assessed)                                           Pertinent Vitals/Pain Pain Score:  (reports pain is "manageable") Pain Location: back Pain Intervention(s): Premedicated before session;Monitored during session    Home Living Family/patient expects to be discharged to:: Private residence Living Arrangements: Spouse/significant other;Children Available Help at Discharge: Family;Available 24 hours/day Type of Home: House Home Access: Stairs to enter Entrance Stairs-Rails: Doctor, general practice of Steps: 4 Home Layout: One level        Prior Function Level of Independence: Independent               Hand Dominance   Dominant Hand: Right    Extremity/Trunk Assessment   Upper Extremity Assessment: Defer to OT evaluation           Lower Extremity Assessment: Generalized weakness      Cervical / Trunk Assessment: Normal;Other exceptions  Communication   Communication: No difficulties  Cognition Arousal/Alertness: Awake/alert Behavior During Therapy: WFL for tasks assessed/performed Overall Cognitive Status: Within Functional Limits for tasks assessed  General Comments General comments (skin integrity, edema, etc.): Pt able to don LSO independently    Exercises        Assessment/Plan    PT Assessment Patient needs continued PT services  PT Diagnosis Abnormality  of gait;Generalized weakness;Acute pain   PT Problem List Decreased strength;Decreased activity tolerance;Decreased mobility;Pain  PT Treatment Interventions DME instruction;Gait training;Stair training;Functional mobility training;Therapeutic activities;Therapeutic exercise;Balance training;Patient/family education   PT Goals (Current goals can be found in the Care Plan section) Acute Rehab PT Goals Patient Stated Goal: Just want to make sure I don't do anything to mess this up PT Goal Formulation: With patient Time For Goal Achievement: 04/18/14 Potential to Achieve Goals: Good    Frequency Min 5X/week   Barriers to discharge        Co-evaluation               End of Session Equipment Utilized During Treatment: Gait belt;Back brace Activity Tolerance: Patient tolerated treatment well Patient left: in bed (seated EOB) Nurse Communication: Mobility status         Time: 5409-8119 PT Time Calculation (min) (ACUTE ONLY): 30 min   Charges:   PT Evaluation $Initial PT Evaluation Tier I: 1 Procedure PT Treatments $Gait Training: 8-22 mins   PT G Codes:        Tedd Sias 04/04/2014, 8:37 AM

## 2014-04-05 ENCOUNTER — Encounter (HOSPITAL_COMMUNITY): Payer: Self-pay | Admitting: Neurological Surgery

## 2014-04-05 LAB — GLUCOSE, CAPILLARY: Glucose-Capillary: 106 mg/dL — ABNORMAL HIGH (ref 70–99)

## 2014-04-05 MED ORDER — OXYCODONE-ACETAMINOPHEN 5-325 MG PO TABS: 1.0000 | ORAL_TABLET | ORAL | Status: DC | PRN

## 2014-04-05 MED ORDER — METHOCARBAMOL 500 MG PO TABS: 500.0000 mg | ORAL_TABLET | Freq: Four times a day (QID) | ORAL | Status: AC | PRN

## 2014-04-05 NOTE — Progress Notes (Signed)
Physical Therapy Treatment Patient Details Name: Melanie CustardKaren V Lanphere MRN: [id number] DOB: 1970-09-23 Today's Date: 04/05/2014    History of Present Illness 44 y.o. s/p MAXIMUM ACCESS SURGERY (MAS) POSTERIOR LUMBAR INTERBODY FUSION LUMBAR FOUR - FIVE TO LUMBAR FIVE- SACRAL ONE    PT Comments    Pt progressing towards physical therapy goals. Was able to perform transfers and ambulation without physical assistance, and maintained back precautions well. Pt and husband anticipate d/c home today. Will continue to follow.   Follow Up Recommendations  Outpatient PT;Supervision - Intermittent (When appropriate per post-op protocol)     Equipment Recommendations  None recommended by PT    Recommendations for Other Services       Precautions / Restrictions Precautions Precautions: Back Precaution Comments: Pt able to recall and adhere to 3/3 precautions Required Braces or Orthoses: Spinal Brace Spinal Brace: Lumbar corset;Applied in sitting position Restrictions Weight Bearing Restrictions: No    Mobility  Bed Mobility Overal bed mobility: Modified Independent Bed Mobility: Rolling;Sidelying to Sit Rolling: Modified independent (Device/Increase time);Supervision Sidelying to sit: Modified independent (Device/Increase time)       General bed mobility comments: Adheres to precautions using log roll technique from flat bed without rails and no cues  Transfers Overall transfer level: Modified independent Equipment used: None Transfers: Sit to/from Stand Sit to Stand: Modified independent (Device/Increase time)         General transfer comment: Pt demonstrated proper hand placement and safety awareness during transfers. Maintained back precautions well.   Ambulation/Gait Ambulation/Gait assistance: Supervision Ambulation Distance (Feet): 500 Feet Assistive device: None Gait Pattern/deviations: Step-through pattern;Decreased stride length Gait velocity: decreased  Gait velocity  interpretation: Below normal speed for age/gender General Gait Details: Pt moving slowly and guarded with ambulation. Maintains back precautions well.   Stairs         General stair comments: Pt reports she feels comfortable with negotiating stairs at home and does not wish to practice them again prior to returning home.   Wheelchair Mobility    Modified Rankin (Stroke Patients Only)       Balance Overall balance assessment: Needs assistance Sitting-balance support: Feet supported;No upper extremity supported Sitting balance-Leahy Scale: Good     Standing balance support: No upper extremity supported Standing balance-Leahy Scale: Fair                      Cognition Arousal/Alertness: Awake/alert Behavior During Therapy: WFL for tasks assessed/performed Overall Cognitive Status: Within Functional Limits for tasks assessed                      Exercises      General Comments General comments (skin integrity, edema, etc.): Discussed maintaining back precautions with showering and during donning/doffing of brace.       Pertinent Vitals/Pain Pain Assessment: Faces Faces Pain Scale: Hurts a little bit Pain Location: Back Pain Descriptors / Indicators: Aching Pain Intervention(s): Limited activity within patient's tolerance;Monitored during session;RN gave pain meds during session    Home Living                      Prior Function            PT Goals (current goals can now be found in the care plan section) Acute Rehab PT Goals Patient Stated Goal: Just want to make sure I don't do anything to mess this up PT Goal Formulation: With patient Time For Goal Achievement: 04/18/14 Potential to  Achieve Goals: Good Progress towards PT goals: Progressing toward goals    Frequency  Min 5X/week    PT Plan Current plan remains appropriate    Co-evaluation             End of Session Equipment Utilized During Treatment: Back  brace Activity Tolerance: Patient tolerated treatment well Patient left: in bed;with family/visitor present;with call bell/phone within reach (seated EOB)     Time: 1610-9604 PT Time Calculation (min) (ACUTE ONLY): 21 min  Charges:  $Gait Training: 8-22 mins                    G Codes:      Conni Slipper 2014-04-17, 9:29 AM  Conni Slipper, PT, DPT Acute Rehabilitation Services Pager: [phone]

## 2014-04-05 NOTE — Progress Notes (Signed)
Patient alert and oriented, mae's well, voiding adequate amount of urine, swallowing without difficulty, no c/o pain. Patient discharged home with family. Script and discharged instructions given to patient. Patient and family stated understanding of d/c instructions given and has an appointment with MD. Aisha Charmeka [place] RN 

## 2014-04-05 NOTE — Discharge Summary (Signed)
Physician Discharge Summary  Patient ID: Melanie Peters MRN: [id number] DOB/AGE: 30-Oct-1970 44 y.o.  Admit date: 04/03/2014 Discharge date: 04/05/2014  Admission Diagnoses: lumbar spondylosis/ failed back syndrome   Discharge Diagnoses: same   Discharged Condition: good  Hospital Course: The patient was admitted on 04/03/2014 and taken to the operating room where the patient underwent PLIF L4-5 L5-S1. The patient tolerated the procedure well and was taken to the recovery room and then to the floor in stable condition. The hospital course was routine. There were no complications. The wound remained clean dry and intact. Pt had appropriate back soreness. No complaints of leg pain or new N/T/W. The patient remained afebrile with stable vital signs, and tolerated a regular diet. The patient continued to increase activities, and pain was well controlled with oral pain medications.   Consults: None  Significant Diagnostic Studies:  Results for orders placed or performed during the hospital encounter of 04/03/14  Glucose, capillary  Result Value Ref Range   Glucose-Capillary 136 (H) 70 - 99 mg/dL  Glucose, capillary  Result Value Ref Range   Glucose-Capillary 163 (H) 70 - 99 mg/dL  Glucose, capillary  Result Value Ref Range   Glucose-Capillary 181 (H) 70 - 99 mg/dL   Comment 1 Notify RN    Comment 2 Documented in Char   Glucose, capillary  Result Value Ref Range   Glucose-Capillary 161 (H) 70 - 99 mg/dL  Glucose, capillary  Result Value Ref Range   Glucose-Capillary 178 (H) 70 - 99 mg/dL  Glucose, capillary  Result Value Ref Range   Glucose-Capillary 149 (H) 70 - 99 mg/dL  Glucose, capillary  Result Value Ref Range   Glucose-Capillary 197 (H) 70 - 99 mg/dL   Comment 1 Notify RN    Comment 2 Documented in Char     Chest 2 View  03/27/2014   CLINICAL DATA:  Preop for L4-L5 fusion  EXAM: CHEST  2 VIEW  COMPARISON:  10/13/2004  FINDINGS: Cardiomediastinal silhouette is stable. No  acute infiltrate or pleural effusion. No pulmonary edema. Bony thorax is unremarkable.  IMPRESSION: No active cardiopulmonary disease.   Electronically Signed   By: Natasha Mead M.D.   On: 03/27/2014 17:16   Dg Lumbar Spine 2-3 Views  04/03/2014   CLINICAL DATA:  Lumbar disc protrusion.  EXAM: LUMBAR SPINE - 2-3 VIEW; DG C-ARM 61-120 MIN  COMPARISON:  MRI dated 01/04/2014  FINDINGS: AP and lateral C-arm images demonstrate the patient has undergone interbody and posterior fusion at L4-5 and L5-S1.  IMPRESSION: Lumbar fusions performed at L4-5 and L5-S1.  Alignment is anatomic.   Electronically Signed   By: Francene Boyers M.D.   On: 04/03/2014 13:07   Dg C-arm 1-60 Min  04/03/2014   CLINICAL DATA:  Lumbar disc protrusion.  EXAM: LUMBAR SPINE - 2-3 VIEW; DG C-ARM 61-120 MIN  COMPARISON:  MRI dated 01/04/2014  FINDINGS: AP and lateral C-arm images demonstrate the patient has undergone interbody and posterior fusion at L4-5 and L5-S1.  IMPRESSION: Lumbar fusions performed at L4-5 and L5-S1.  Alignment is anatomic.   Electronically Signed   By: Francene Boyers M.D.   On: 04/03/2014 13:07    Antibiotics:  Anti-infectives    Start     Dose/Rate Route Frequency Ordered Stop   04/03/14 1500  ceFAZolin (ANCEF) IVPB 1 g/50 mL premix     1 g 100 mL/hr over 30 Minutes Intravenous Every 8 hours 04/03/14 1450 04/03/14 2203   04/03/14 0800  bacitracin  50,000 Units in sodium chloride irrigation 0.9 % 500 mL irrigation  Status:  Discontinued       As needed 04/03/14 0959 04/03/14 1257   04/03/14 0600  ceFAZolin (ANCEF) IVPB 2 g/50 mL premix     2 g 100 mL/hr over 30 Minutes Intravenous On call to O.R. 04/02/14 1352 04/03/14 0900      Discharge Exam: Blood pressure 114/63, pulse 53, temperature 98 F (36.7 C), temperature source Oral, resp. rate 18, height 5\' 6"  (1.676 m), weight 248 lb (112.492 kg), last menstrual period 04/03/2014, SpO2 100 %. Neurologic: Grossly normal Incision clean dry and  intact  Discharge Medications:     Medication List    STOP taking these medications        ibuprofen 200 MG tablet  Commonly known as:  ADVIL,MOTRIN      TAKE these medications        esomeprazole 40 MG capsule  Commonly known as:  NEXIUM  Take 40 mg by mouth daily at 12 noon.     hydrochlorothiazide 25 MG tablet  Commonly known as:  HYDRODIURIL  Take 25 mg by mouth daily.     metFORMIN 500 MG tablet  Commonly known as:  GLUCOPHAGE  Take 500 mg by mouth 2 (two) times daily with a meal.     methocarbamol 500 MG tablet  Commonly known as:  ROBAXIN  Take 1 tablet (500 mg total) by mouth every 6 (six) hours as needed for muscle spasms.     norethindrone 0.35 MG tablet  Commonly known as:  MICRONOR,CAMILA,ERRIN  Take 1 tablet by mouth at bedtime.     oxyCODONE-acetaminophen 5-325 MG per tablet  Commonly known as:  PERCOCET/ROXICET  Take 1-2 tablets by mouth every 4 (four) hours as needed for moderate pain.        Disposition: Home   Final Dx: PLIF L4-5 L5-S1      Discharge Instructions    Call MD for:  difficulty breathing, headache or visual disturbances    Complete by:  As directed      Call MD for:  persistant nausea and vomiting    Complete by:  As directed      Call MD for:  redness, tenderness, or signs of infection (pain, swelling, redness, odor or green/yellow discharge around incision site)    Complete by:  As directed      Call MD for:  severe uncontrolled pain    Complete by:  As directed      Call MD for:  temperature >100.4    Complete by:  As directed      Diet - low sodium heart healthy    Complete by:  As directed      Discharge instructions    Complete by:  As directed   No driving, no lifting, no bending or twisting     Increase activity slowly    Complete by:  As directed      Remove dressing in 48 hours    Complete by:  As directed               Signed: Derril Franek S 04/05/2014, 7:50 AM

## 2014-04-07 ENCOUNTER — Ambulatory Visit (HOSPITAL_COMMUNITY)
Admission: AD | Admit: 2014-04-07 | Discharge: 2014-04-08 | Disposition: A | Discharge status: Home / Self Care | Payer: BLUE CROSS/BLUE SHIELD | Source: Ambulatory Visit | Attending: Neurological Surgery | Admitting: Neurological Surgery

## 2014-04-07 ENCOUNTER — Ambulatory Visit (HOSPITAL_COMMUNITY): Payer: BLUE CROSS/BLUE SHIELD

## 2014-04-07 ENCOUNTER — Encounter (HOSPITAL_COMMUNITY): Payer: Self-pay | Admitting: Neurological Surgery

## 2014-04-07 DIAGNOSIS — B349 Viral infection, unspecified: Secondary | ICD-10-CM | POA: Diagnosis not present

## 2014-04-07 DIAGNOSIS — R51 Headache: Secondary | ICD-10-CM | POA: Diagnosis present

## 2014-04-07 DIAGNOSIS — E860 Dehydration: Secondary | ICD-10-CM | POA: Diagnosis not present

## 2014-04-07 DIAGNOSIS — Z79899 Other long term (current) drug therapy: Secondary | ICD-10-CM | POA: Insufficient documentation

## 2014-04-07 DIAGNOSIS — I10 Essential (primary) hypertension: Secondary | ICD-10-CM | POA: Insufficient documentation

## 2014-04-07 DIAGNOSIS — R112 Nausea with vomiting, unspecified: Secondary | ICD-10-CM | POA: Insufficient documentation

## 2014-04-07 DIAGNOSIS — K567 Ileus, unspecified: Secondary | ICD-10-CM

## 2014-04-07 DIAGNOSIS — E119 Type 2 diabetes mellitus without complications: Secondary | ICD-10-CM | POA: Diagnosis not present

## 2014-04-07 DIAGNOSIS — R1115 Cyclical vomiting syndrome unrelated to migraine: Secondary | ICD-10-CM | POA: Diagnosis present

## 2014-04-07 DIAGNOSIS — K219 Gastro-esophageal reflux disease without esophagitis: Secondary | ICD-10-CM | POA: Insufficient documentation

## 2014-04-07 DIAGNOSIS — K9189 Other postprocedural complications and disorders of digestive system: Secondary | ICD-10-CM

## 2014-04-07 DIAGNOSIS — Z87891 Personal history of nicotine dependence: Secondary | ICD-10-CM | POA: Diagnosis not present

## 2014-04-07 LAB — CBC
HCT: 33.2 % — ABNORMAL LOW (ref 36.0–46.0)
Hemoglobin: 10.5 g/dL — ABNORMAL LOW (ref 12.0–15.0)
MCH: 27.1 pg (ref 26.0–34.0)
MCHC: 31.6 g/dL (ref 30.0–36.0)
MCV: 85.6 fL (ref 78.0–100.0)
Platelets: 269 10*3/uL (ref 150–400)
RBC: 3.88 MIL/uL (ref 3.87–5.11)
RDW: 13.8 % (ref 11.5–15.5)
WBC: 7.8 10*3/uL (ref 4.0–10.5)

## 2014-04-07 LAB — BASIC METABOLIC PANEL
ANION GAP: 9 (ref 5–15)
BUN: 10 mg/dL (ref 6–23)
CALCIUM: 9 mg/dL (ref 8.4–10.5)
CO2: 25 mmol/L (ref 19–32)
CREATININE: 0.69 mg/dL (ref 0.50–1.10)
Chloride: 102 mmol/L (ref 96–112)
GFR calc non Af Amer: >90 mL/min (ref >90)
GLUCOSE: 94 mg/dL (ref 70–99)
Potassium: 3.9 mmol/L (ref 3.5–5.1)
SODIUM: 136 mmol/L (ref 135–145)

## 2014-04-07 LAB — GLUCOSE, CAPILLARY
Glucose-Capillary: 125 mg/dL — ABNORMAL HIGH (ref 70–99)
Glucose-Capillary: 186 mg/dL — ABNORMAL HIGH (ref 70–99)

## 2014-04-07 MED ORDER — ACETAMINOPHEN 325 MG PO TABS: 650.0000 mg | ORAL_TABLET | ORAL | Status: DC | PRN

## 2014-04-07 MED ORDER — ONDANSETRON HCL 4 MG/2ML IJ SOLN: 4.0000 mg | INTRAMUSCULAR | Status: DC | PRN

## 2014-04-07 MED ORDER — INSULIN ASPART 100 UNIT/ML ~~LOC~~ SOLN: 0.0000 [IU] | Freq: Every day | SUBCUTANEOUS | Status: DC

## 2014-04-07 MED ORDER — PANTOPRAZOLE SODIUM 40 MG IV SOLR
40.0000 mg | Freq: Every day | INTRAVENOUS | Status: DC
  Administered 2014-04-07: 40 mg via INTRAVENOUS
  Filled 2014-04-07: qty 40

## 2014-04-07 MED ORDER — METHOCARBAMOL 500 MG PO TABS: 500.0000 mg | ORAL_TABLET | Freq: Four times a day (QID) | ORAL | Status: DC | PRN

## 2014-04-07 MED ORDER — PROMETHAZINE HCL 25 MG/ML IJ SOLN: 12.5000 mg | Freq: Four times a day (QID) | INTRAMUSCULAR | Status: DC | PRN

## 2014-04-07 MED ORDER — ACETAMINOPHEN 650 MG RE SUPP: 650.0000 mg | RECTAL | Status: DC | PRN

## 2014-04-07 MED ORDER — POTASSIUM CHLORIDE IN NACL 20-0.9 MEQ/L-% IV SOLN
INTRAVENOUS | Status: DC
  Administered 2014-04-07 – 2014-04-08 (×2): via INTRAVENOUS
  Filled 2014-04-07 (×4): qty 1000

## 2014-04-07 MED ORDER — BISACODYL 10 MG RE SUPP: 10.0000 mg | Freq: Every day | RECTAL | Status: DC | PRN

## 2014-04-07 MED ORDER — FLEET ENEMA 7-19 GM/118ML RE ENEM: 1.0000 | ENEMA | Freq: Once | RECTAL | Status: AC | PRN

## 2014-04-07 MED ORDER — METHOCARBAMOL 1000 MG/10ML IJ SOLN
500.0000 mg | Freq: Four times a day (QID) | INTRAVENOUS | Status: DC | PRN
  Filled 2014-04-07: qty 5

## 2014-04-07 MED ORDER — POLYETHYLENE GLYCOL 3350 17 G PO PACK: 17.0000 g | PACK | Freq: Every day | ORAL | Status: DC | PRN

## 2014-04-07 MED ORDER — INSULIN ASPART 100 UNIT/ML ~~LOC~~ SOLN: 0.0000 [IU] | Freq: Three times a day (TID) | SUBCUTANEOUS | Status: DC

## 2014-04-07 MED ORDER — KETOROLAC TROMETHAMINE 30 MG/ML IJ SOLN
30.0000 mg | Freq: Four times a day (QID) | INTRAMUSCULAR | Status: DC | PRN
  Administered 2014-04-07 (×2): 30 mg via INTRAVENOUS
  Filled 2014-04-07 (×2): qty 1

## 2014-04-07 MED ORDER — DEXAMETHASONE SODIUM PHOSPHATE 4 MG/ML IJ SOLN
4.0000 mg | Freq: Four times a day (QID) | INTRAMUSCULAR | Status: DC
  Administered 2014-04-07 – 2014-04-08 (×4): 4 mg via INTRAVENOUS
  Filled 2014-04-07 (×4): qty 1

## 2014-04-07 MED ORDER — METFORMIN HCL 500 MG PO TABS
500.0000 mg | ORAL_TABLET | Freq: Two times a day (BID) | ORAL | Status: DC
  Administered 2014-04-08: 500 mg via ORAL
  Filled 2014-04-07: qty 1

## 2014-04-07 MED ORDER — DEXAMETHASONE 4 MG PO TABS
4.0000 mg | ORAL_TABLET | Freq: Four times a day (QID) | ORAL | Status: DC
  Administered 2014-04-08: 4 mg via ORAL
  Filled 2014-04-07: qty 1

## 2014-04-07 MED ORDER — HYDROCHLOROTHIAZIDE 25 MG PO TABS
25.0000 mg | ORAL_TABLET | Freq: Every day | ORAL | Status: DC
  Administered 2014-04-08: 25 mg via ORAL
  Filled 2014-04-07: qty 1

## 2014-04-07 NOTE — H&P (Signed)
Subjective: Patient is a 44 y.o. female who complains of 36 hr history of N/V, followed by headache . Onset of symptoms was 36 hours ago, stable since that time. She underwent posterior lumbar interbody fusion 4 days ago. She has had some upper posterior thigh pain which is not uncommon for days after fusion. No numbness tingling or weakness.  The pain is rated moderate, and is located at the across the lower back but this is expected after surgery. The pain is described as aching and occurs all day.  Symptoms are exacerbated by sitting up or eating or drinking. Her 44 year old daughter started throwing up about 3-4 hours before she did. Her daughter throughout for about 5 hours, slept the next day and has improved. Patient denies fever or chills. She has had no bowel movement and stop having flatus several hours before the emesis started. The headache started after the nausea and vomiting. She does not feel that the nausea and vomiting related to the headaches. She has no abdominal pain. No visual changes.  Past Medical History  Diagnosis Date  . Complication of anesthesia   . PONV (postoperative nausea and vomiting)   . Family history of adverse reaction to anesthesia     mother N/V  . Hypertension   . Pneumonia 03/01/14  . Diabetes mellitus without complication     on meds  . GERD (gastroesophageal reflux disease)   . History of hiatal hernia     Past Surgical History  Procedure Laterality Date  . Mandible fracture surgery Bilateral 1988  . Cholecystectomy  2007  . Cesarean section  2005, 2009  . Breast surgery  2005    cyst removal breast x2  . Back surgery  2011    lumbar  . Maximum access (mas)posterior lumbar interbody fusion (plif) 2 level N/A 04/03/2014    Procedure: FOR MAXIMUM ACCESS SURGERY (MAS) POSTERIOR LUMBAR INTERBODY FUSION LUMBAR FOUR - FIVE TO LUMBAR FIVE- SACRAL ONE;  Surgeon: Tia Alertavid S Jerrik Housholder, MD;  Location: [facility];  Service: Neurosurgery;  Laterality: N/A;    No  Known Allergies  History  Substance Use Topics  . Smoking status: Former Smoker -- 0.25 packs/day for 15 years    Types: Cigarettes    Quit date: 03/28/2003  . Smokeless tobacco: Never Used  . Alcohol Use: Yes     Comment: social    History reviewed. No pertinent family history. Prior to Admission medications   Medication Sig Start Date End Date Taking? Authorizing Provider  esomeprazole (NEXIUM) 40 MG capsule Take 40 mg by mouth daily at 12 noon.   Yes Historical Provider, MD  hydrochlorothiazide (HYDRODIURIL) 25 MG tablet Take 25 mg by mouth daily.   Yes Historical Provider, MD  metFORMIN (GLUCOPHAGE) 500 MG tablet Take 500 mg by mouth 2 (two) times daily with a meal.   Yes Historical Provider, MD  methocarbamol (ROBAXIN) 500 MG tablet Take 1 tablet (500 mg total) by mouth every 6 (six) hours as needed for muscle spasms. 04/05/14  Yes Tia Alertavid S Raywood Wailes, MD  oxyCODONE-acetaminophen (PERCOCET/ROXICET) 5-325 MG per tablet Take 1-2 tablets by mouth every 4 (four) hours as needed for moderate pain. 04/05/14  Yes Tia Alertavid S Maddox Hlavaty, MD     Review of Systems  Positive ROS: neg  All other systems have been reviewed and were otherwise negative with the exception of those mentioned in the HPI and as above.  Objective: Vital signs in last 24 hours: Temp:  [98.7 F (37.1 C)] 98.7 F (  37.1 C) (02/14 1159) Pulse Rate:  [77] 77 (02/14 1159) Resp:  [18] 18 (02/14 1159) BP: (117)/(68) 117/68 mmHg (02/14 1159) SpO2:  [96 %] 96 % (02/14 1159)  General Appearance: Alert, cooperative, no distress, appears stated age, appears nontoxic and looks quite good Head: Normocephalic, without obvious abnormality, atraumatic Eyes: PERRL, conjunctiva/corneas clear, EOM's intact   Throat: Lips, mucosa, and tongue normal; teeth and gums normal Back: Incision clean dry and intact without evidence of infection or drainage or swelling Lungs: , respirations unlabored Heart: Regular rate and rhythm Abdomen: Soft,  non-tender, nondistended Extremities: Extremities normal, atraumatic, no cyanosis or edema Pulses: 2+ and symmetric all extremities Skin: Skin color, texture, turgor normal, no rashes or lesions  NEUROLOGIC:   Mental status: alert and oriented, no aphasia, good attention span, Fund of knowledge/ memory ok Motor Exam - grossly normal Sensory Exam - grossly normal Reflexes: not tested Coordination - grossly normal Gait - grossly normal Balance - grossly normal Cranial Nerves: I: smell Not tested  II: visual acuity  OS: na    OD: na  II: visual fields Full to confrontation  II: pupils Equal, round, reactive to light  III,VII: ptosis None  III,IV,VI: extraocular muscles  Full ROM  V: mastication Normal  V: facial light touch sensation  Normal  V,VII: corneal reflex  Present  VII: facial muscle function - upper  Normal  VII: facial muscle function - lower Normal  VIII: hearing Not tested  IX: soft palate elevation  Normal  IX,X: gag reflex Present  XI: trapezius strength  5/5  XI: sternocleidomastoid strength 5/5  XI: neck flexion strength  5/5  XII: tongue strength  Normal    Data Review Lab Results  Component Value Date   WBC 7.7 03/27/2014   HGB 12.4 03/27/2014   HCT 37.5 03/27/2014   MCV 83.3 03/27/2014   PLT 289 03/27/2014   Lab Results  Component Value Date   NA 137 03/27/2014   K 3.4* 03/27/2014   CL 103 03/27/2014   CO2 25 03/27/2014   BUN 15 03/27/2014   CREATININE 0.74 03/27/2014   GLUCOSE 172* 03/27/2014   Lab Results  Component Value Date   INR 1.01 03/27/2014    Assessment/Plan: Patient is admitted with intractable nausea and vomiting and dehydration with associated headache. I initially had concern that this could potentially be related to a delayed CSF leak after back surgery 4 days ago, but after hearing that her daughter had similar emesis started the same day I suspect she has the same viral syndrome I presume her daughter had. Certainly this  type of virus seems to be going around [place] city. She has no flatus and therefore I suspect she also has an ileus which certainly doesn't help matters. She still could have a CSF leak, but at the time of closure of her surgery Dr. Mikal Plane and I both looked specifically at the dura to make sure there was no unintended durotomy. Therefore I think given the fact that we specifically looked for a leak at the closure of her surgery and the fact that her daughter had similar emesis starting the same day, I think this is a viral syndrome until proven otherwise. We will hydrate her, limit her narcotics, treat her leg pain with steroids, treat her nausea with Zofran and Phenergan, place an NG tube if necessary, and look for improvement. She is pleased with this plan.   Jamille Fisher S 04/07/2014 1:14 PM

## 2014-04-08 DIAGNOSIS — B349 Viral infection, unspecified: Secondary | ICD-10-CM | POA: Diagnosis not present

## 2014-04-08 LAB — GLUCOSE, CAPILLARY
Glucose-Capillary: 163 mg/dL — ABNORMAL HIGH (ref 70–99)
Glucose-Capillary: 176 mg/dL — ABNORMAL HIGH (ref 70–99)

## 2014-04-08 MED ORDER — PANTOPRAZOLE SODIUM 40 MG PO TBEC
40.0000 mg | DELAYED_RELEASE_TABLET | Freq: Every day | ORAL | Status: DC
  Administered 2014-04-08: 40 mg via ORAL
  Filled 2014-04-08: qty 1

## 2014-04-08 NOTE — Progress Notes (Signed)
Subjective: Patient reports she is doing much better. No headache or nausea or vomiting. Leg pain is gone. She reports that her husband and son has now developed a virus and spent the night vomiting. She has now passed some flatus.  Objective: Vital signs in last 24 hours: Temp:  [98.3 F (36.8 C)-99 F (37.2 C)] 98.3 F (36.8 C) (02/15 0933) Pulse Rate:  [65-77] 73 (02/15 0933) Resp:  [18-22] 20 (02/15 0933) BP: (117-129)/(65-73) 128/71 mmHg (02/15 0933) SpO2:  [95 %-99 %] 98 % (02/15 0933)  Intake/Output from previous day: 02/14 0730 - 02/15 0729 In: -  Out: 1 [Urine:1] Intake/Output this shift:    Neurologic: Grossly normal  Lab Results: Lab Results  Component Value Date   WBC 7.8 04/07/2014   HGB 10.5* 04/07/2014   HCT 33.2* 04/07/2014   MCV 85.6 04/07/2014   PLT 269 04/07/2014   Lab Results  Component Value Date   INR 1.01 03/27/2014   BMET Lab Results  Component Value Date   NA 136 04/07/2014   K 3.9 04/07/2014   CL 102 04/07/2014   CO2 25 04/07/2014   GLUCOSE 94 04/07/2014   BUN 10 04/07/2014   CREATININE 0.69 04/07/2014   CALCIUM 9.0 04/07/2014    Studies/Results: Dg Abd 1 View  04/07/2014   CLINICAL DATA:  Vomiting. Postop ileus. Four days postop from lumbar spine fusion.  EXAM: ABDOMEN - 1 VIEW  COMPARISON:  None.  FINDINGS: The bowel gas pattern is normal. No radio-opaque calculi identified. Right upper quadrant surgical clips seen. Lumbar spine fusion hardware seen from levels of L4-S1.  IMPRESSION: Normal bowel gas pattern.  No acute findings.   Electronically Signed   By: Myles RosenthalJohn  Stahl M.D.   On: 04/07/2014 17:30    Assessment/Plan: Doing much better. We will advance diet and she how she tolerates that. She can be discharged when her husband is over his illness and can come pick her up in this weather.      Leyanna Bittman S 04/08/2014, 9:40 AM

## 2014-04-08 NOTE — Discharge Summary (Signed)
Physician Discharge Summary  Patient ID: Melanie Peters MRN: [id number] DOB/AGE: [birth year] 44 y.o.  Admit date: 04/07/2014 Discharge date: 04/08/2014  Admission Diagnoses: Viral syndrome with emesis and dehydration   Discharge Diagnoses: Same   Discharged Condition: good  Hospital Course: The patient was admitted on 04/07/2014 with intractable nausea and vomiting for days after lumbar spinal fusion. Her daughter also had nausea and vomiting starting the same day. This was felt to most likely represent a viral syndrome but she had no evidence of flatus and was dehydrated. The hospital course was routine. There were no complications. The wound remained clean dry and intact. Pt had appropriate back soreness. No complaints of leg pain or new N/T/W. The patient remained afebrile with stable vital signs, and tolerated a regular diet. The patient continued to increase activities, and pain was well controlled with oral pain medications.   Consults: None  Significant Diagnostic Studies:  Results for orders placed or performed during the hospital encounter of 04/07/14  CBC  Result Value Ref Range   WBC 7.8 4.0 - 10.5 K/uL   RBC 3.88 3.87 - 5.11 MIL/uL   Hemoglobin 10.5 (L) 12.0 - 15.0 g/dL   HCT 32.4 (L) 40.1 - 02.7 %   MCV 85.6 78.0 - 100.0 fL   MCH 27.1 26.0 - 34.0 pg   MCHC 31.6 30.0 - 36.0 g/dL   RDW 25.3 66.4 - 40.3 %   Platelets 269 150 - 400 K/uL  Basic Metabolic Panel  Result Value Ref Range   Sodium 136 135 - 145 mmol/L   Potassium 3.9 3.5 - 5.1 mmol/L   Chloride 102 96 - 112 mmol/L   CO2 25 19 - 32 mmol/L   Glucose, Bld 94 70 - 99 mg/dL   BUN 10 6 - 23 mg/dL   Creatinine, Ser 4.74 0.50 - 1.10 mg/dL   Calcium 9.0 8.4 - 25.9 mg/dL   GFR calc non Af Amer >90 >90 mL/min   GFR calc Af Amer >90 >90 mL/min   Anion gap 9 5 - 15  Glucose, capillary  Result Value Ref Range   Glucose-Capillary 125 (H) 70 - 99 mg/dL   Comment 1 Notify RN    Comment 2 Documented in Char    Glucose, capillary  Result Value Ref Range   Glucose-Capillary 186 (H) 70 - 99 mg/dL   Comment 1 Notify RN    Comment 2 Documented in Char   Glucose, capillary  Result Value Ref Range   Glucose-Capillary 163 (H) 70 - 99 mg/dL    Chest 2 View  06/27/3873   CLINICAL DATA:  Preop for L4-L5 fusion  EXAM: CHEST  2 VIEW  COMPARISON:  10/13/2004  FINDINGS: Cardiomediastinal silhouette is stable. No acute infiltrate or pleural effusion. No pulmonary edema. Bony thorax is unremarkable.  IMPRESSION: No active cardiopulmonary disease.   Electronically Signed   By: Natasha Mead M.D.   On: 03/27/2014 17:16   Dg Lumbar Spine 2-3 Views  04/03/2014   CLINICAL DATA:  Lumbar disc protrusion.  EXAM: LUMBAR SPINE - 2-3 VIEW; DG C-ARM 61-120 MIN  COMPARISON:  MRI dated 01/04/2014  FINDINGS: AP and lateral C-arm images demonstrate the patient has undergone interbody and posterior fusion at L4-5 and L5-S1.  IMPRESSION: Lumbar fusions performed at L4-5 and L5-S1.  Alignment is anatomic.   Electronically Signed   By: Francene Boyers M.D.   On: 04/03/2014 13:07   Dg Abd 1 View  04/07/2014   CLINICAL DATA:  Vomiting.  Postop ileus. Four days postop from lumbar spine fusion.  EXAM: ABDOMEN - 1 VIEW  COMPARISON:  None.  FINDINGS: The bowel gas pattern is normal. No radio-opaque calculi identified. Right upper quadrant surgical clips seen. Lumbar spine fusion hardware seen from levels of L4-S1.  IMPRESSION: Normal bowel gas pattern.  No acute findings.   Electronically Signed   By: Myles Rosenthal M.D.   On: 04/07/2014 17:30   Dg C-arm 1-60 Min  04/03/2014   CLINICAL DATA:  Lumbar disc protrusion.  EXAM: LUMBAR SPINE - 2-3 VIEW; DG C-ARM 61-120 MIN  COMPARISON:  MRI dated 01/04/2014  FINDINGS: AP and lateral C-arm images demonstrate the patient has undergone interbody and posterior fusion at L4-5 and L5-S1.  IMPRESSION: Lumbar fusions performed at L4-5 and L5-S1.  Alignment is anatomic.   Electronically Signed   By: Francene Boyers  M.D.   On: 04/03/2014 13:07    Antibiotics:  Anti-infectives    None      Discharge Exam: Blood pressure 128/71, pulse 73, temperature 98.3 F (36.8 C), temperature source Oral, resp. rate 20, last menstrual period 03/31/2014, SpO2 98 %. Neurologic: Grossly normal Incision clean dry and intact  Discharge Medications:     Medication List    TAKE these medications        esomeprazole 40 MG capsule  Commonly known as:  NEXIUM  Take 40 mg by mouth daily at 12 noon.     hydrochlorothiazide 25 MG tablet  Commonly known as:  HYDRODIURIL  Take 25 mg by mouth daily.     metFORMIN 500 MG tablet  Commonly known as:  GLUCOPHAGE  Take 500 mg by mouth 2 (two) times daily with a meal.     methocarbamol 500 MG tablet  Commonly known as:  ROBAXIN  Take 1 tablet (500 mg total) by mouth every 6 (six) hours as needed for muscle spasms.     oxyCODONE-acetaminophen 5-325 MG per tablet  Commonly known as:  PERCOCET/ROXICET  Take 1-2 tablets by mouth every 4 (four) hours as needed for moderate pain.        Disposition: Home   Final Dx: Viral syndrome with emesis and dehydration      Discharge Instructions    Call MD for:  difficulty breathing, headache or visual disturbances    Complete by:  As directed      Call MD for:  persistant nausea and vomiting    Complete by:  As directed      Call MD for:  redness, tenderness, or signs of infection (pain, swelling, redness, odor or green/yellow discharge around incision site)    Complete by:  As directed      Call MD for:  severe uncontrolled pain    Complete by:  As directed      Call MD for:  temperature >100.4    Complete by:  As directed      Diet - low sodium heart healthy    Complete by:  As directed      Discharge instructions    Complete by:  As directed   No strenuous activity or bending or twisting     Increase activity slowly    Complete by:  As directed               Signed: Grey Schlauch S 04/08/2014, 9:42  AM

## 2014-04-17 ENCOUNTER — Encounter (HOSPITAL_COMMUNITY): Payer: Self-pay | Admitting: Family Medicine

## 2014-04-17 ENCOUNTER — Emergency Department (HOSPITAL_COMMUNITY)
Admission: EM | Admit: 2014-04-17 | Discharge: 2014-04-17 | Disposition: A | Discharge status: Home / Self Care | Payer: BLUE CROSS/BLUE SHIELD | Attending: Emergency Medicine | Admitting: Emergency Medicine

## 2014-04-17 DIAGNOSIS — Z8701 Personal history of pneumonia (recurrent): Secondary | ICD-10-CM | POA: Insufficient documentation

## 2014-04-17 DIAGNOSIS — I10 Essential (primary) hypertension: Secondary | ICD-10-CM | POA: Insufficient documentation

## 2014-04-17 DIAGNOSIS — Z87891 Personal history of nicotine dependence: Secondary | ICD-10-CM | POA: Diagnosis not present

## 2014-04-17 DIAGNOSIS — M79605 Pain in left leg: Secondary | ICD-10-CM | POA: Diagnosis present

## 2014-04-17 DIAGNOSIS — E119 Type 2 diabetes mellitus without complications: Secondary | ICD-10-CM | POA: Insufficient documentation

## 2014-04-17 DIAGNOSIS — M7989 Other specified soft tissue disorders: Secondary | ICD-10-CM

## 2014-04-17 DIAGNOSIS — K219 Gastro-esophageal reflux disease without esophagitis: Secondary | ICD-10-CM | POA: Diagnosis not present

## 2014-04-17 DIAGNOSIS — Z79899 Other long term (current) drug therapy: Secondary | ICD-10-CM | POA: Insufficient documentation

## 2014-04-17 LAB — I-STAT CHEM 8, ED
BUN: 20 mg/dL (ref 6–23)
CHLORIDE: 97 mmol/L (ref 96–112)
Calcium, Ion: 1.18 mmol/L (ref 1.12–1.23)
Creatinine, Ser: 0.7 mg/dL (ref 0.50–1.10)
Glucose, Bld: 120 mg/dL — ABNORMAL HIGH (ref 70–99)
HCT: 42 % (ref 36.0–46.0)
Hemoglobin: 14.3 g/dL (ref 12.0–15.0)
Potassium: 3.8 mmol/L (ref 3.5–5.1)
SODIUM: 137 mmol/L (ref 135–145)
TCO2: 23 mmol/L (ref 0–100)

## 2014-04-17 MED ORDER — HYDROCODONE-ACETAMINOPHEN 5-325 MG PO TABS: 1.0000 | ORAL_TABLET | ORAL | Status: AC | PRN

## 2014-04-17 MED ORDER — ACETAMINOPHEN 500 MG PO TABS
500.0000 mg | ORAL_TABLET | Freq: Once | ORAL | Status: AC
  Administered 2014-04-17: 500 mg via ORAL
  Filled 2014-04-17: qty 1

## 2014-04-17 NOTE — Progress Notes (Signed)
VASCULAR LAB PRELIMINARY  PRELIMINARY  PRELIMINARY  PRELIMINARY  Left lower extremity venous duplex completed.    Preliminary report:  Left:  No evidence of DVT, superficial thrombosis, or Baker's cyst.  Ranie Chinchilla, RVS 04/17/2014, 4:34 PM

## 2014-04-17 NOTE — Discharge Instructions (Signed)
Contact Dermatitis °Contact dermatitis is a reaction to certain substances that touch the skin. Contact dermatitis can be either irritant contact dermatitis or allergic contact dermatitis. Irritant contact dermatitis does not require previous exposure to the substance for a reaction to occur. Allergic contact dermatitis only occurs if you have been exposed to the substance before. Upon a repeat exposure, your body reacts to the substance.  °CAUSES  °Many substances can cause contact dermatitis. Irritant dermatitis is most commonly caused by repeated exposure to mildly irritating substances, such as: °· Makeup. °· Soaps. °· Detergents. °· Bleaches. °· Acids. °· Metal salts, such as nickel. °Allergic contact dermatitis is most commonly caused by exposure to: °· Poisonous plants. °· Chemicals (deodorants, shampoos). °· Jewelry. °· Latex. °· Neomycin in triple antibiotic cream. °· Preservatives in products, including clothing. °SYMPTOMS  °The area of skin that is exposed may develop: °· Dryness or flaking. °· Redness. °· Cracks. °· Itching. °· Pain or a burning sensation. °· Blisters. °With allergic contact dermatitis, there may also be swelling in areas such as the eyelids, mouth, or genitals.  °DIAGNOSIS  °Your caregiver can usually tell what the problem is by doing a physical exam. In cases where the cause is uncertain and an allergic contact dermatitis is suspected, a patch skin test may be performed to help determine the cause of your dermatitis. °TREATMENT °Treatment includes protecting the skin from further contact with the irritating substance by avoiding that substance if possible. Barrier creams, powders, and gloves may be helpful. Your caregiver may also recommend: °· Steroid creams or ointments applied 2 times daily. For best results, soak the rash area in cool water for 20 minutes. Then apply the medicine. Cover the area with a plastic wrap. You can store the steroid cream in the refrigerator for a "chilly"  effect on your rash. That may decrease itching. Oral steroid medicines may be needed in more severe cases. °· Antibiotics or antibacterial ointments if a skin infection is present. °· Antihistamine lotion or an antihistamine taken by mouth to ease itching. °· Lubricants to keep moisture in your skin. °· Burow's solution to reduce redness and soreness or to dry a weeping rash. Mix one packet or tablet of solution in 2 cups cool water. Dip a clean washcloth in the mixture, wring it out a bit, and put it on the affected area. Leave the cloth in place for 30 minutes. Do this as often as possible throughout the day. °· Taking several cornstarch or baking soda baths daily if the area is too large to cover with a washcloth. °Harsh chemicals, such as alkalis or acids, can cause skin damage that is like a burn. You should flush your skin for 15 to 20 minutes with cold water after such an exposure. You should also seek immediate medical care after exposure. Bandages (dressings), antibiotics, and pain medicine may be needed for severely irritated skin.  °HOME CARE INSTRUCTIONS °· Avoid the substance that caused your reaction. °· Keep the area of skin that is affected away from hot water, soap, sunlight, chemicals, acidic substances, or anything else that would irritate your skin. °· Do not scratch the rash. Scratching may cause the rash to become infected. °· You may take cool baths to help stop the itching. °· Only take over-the-counter or prescription medicines as directed by your caregiver. °· See your caregiver for follow-up care as directed to make sure your skin is healing properly. °SEEK MEDICAL CARE IF:  °· Your condition is not better after 3   days of treatment.  You seem to be getting worse.  You see signs of infection such as swelling, tenderness, redness, soreness, or warmth in the affected area.  You have any problems related to your medicines. Document Released: 02/06/2000 Document Revised: 05/03/2011  Document Reviewed: 07/14/2010 [facility] Patient Information 2015 [place], [place]. This information is not intended to replace advice given to you by your health care provider. Make sure you discuss any questions you have with your health care provider. Peripheral Edema You have swelling in your legs (peripheral edema). This swelling is due to excess accumulation of salt and water in your body. Edema may be a sign of heart, kidney or liver disease, or a side effect of a medication. It may also be due to problems in the leg veins. Elevating your legs and using special support stockings may be very helpful, if the cause of the swelling is due to poor venous circulation. Avoid long periods of standing, whatever the cause. Treatment of edema depends on identifying the cause. Chips, pretzels, pickles and other salty foods should be avoided. Restricting salt in your diet is almost always needed. Water pills (diuretics) are often used to remove the excess salt and water from your body via urine. These medicines prevent the kidney from reabsorbing sodium. This increases urine flow. Diuretic treatment may also result in lowering of potassium levels in your body. Potassium supplements may be needed if you have to use diuretics daily. Daily weights can help you keep track of your progress in clearing your edema. You should call your caregiver for follow up care as recommended. SEEK IMMEDIATE MEDICAL CARE IF:   You have increased swelling, pain, redness, or heat in your legs.  You develop shortness of breath, especially when lying down.  You develop chest or abdominal pain, weakness, or fainting.  You have a fever. Document Released: 03/18/2004 Document Revised: 05/03/2011 Document Reviewed: 02/26/2009 [facility] Patient Information 2015 [place], [place]. This information is not intended to replace advice given to you by your health care provider. Make sure you discuss any questions you have with your health care  provider. Musculoskeletal Pain Musculoskeletal pain is muscle and boney aches and pains. These pains can occur in any part of the body. Your caregiver may treat you without knowing the cause of the pain. They may treat you if blood or urine tests, X-rays, and other tests were normal.  CAUSES There is often not a definite cause or reason for these pains. These pains may be caused by a type of germ (virus). The discomfort may also come from overuse. Overuse includes working out too hard when your body is not fit. Boney aches also come from weather changes. Bone is sensitive to atmospheric pressure changes. HOME CARE INSTRUCTIONS   Ask when your test results will be ready. Make sure you get your test results.  Only take over-the-counter or prescription medicines for pain, discomfort, or fever as directed by your caregiver. If you were given medications for your condition, do not drive, operate machinery or power tools, or sign legal documents for 24 hours. Do not drink alcohol. Do not take sleeping pills or other medications that may interfere with treatment.  Continue all activities unless the activities cause more pain. When the pain lessens, slowly resume normal activities. Gradually increase the intensity and duration of the activities or exercise.  During periods of severe pain, bed rest may be helpful. Lay or sit in any position that is comfortable.  Putting ice on the injured  area.  Put ice in a bag.  Place a towel between your skin and the bag.  Leave the ice on for 15 to 20 minutes, 3 to 4 times a day.  Follow up with your caregiver for continued problems and no reason can be found for the pain. If the pain becomes worse or does not go away, it may be necessary to repeat tests or do additional testing. Your caregiver may need to look further for a possible cause. SEEK IMMEDIATE MEDICAL CARE IF:  You have pain that is getting worse and is not relieved by medications.  You develop  chest pain that is associated with shortness or breath, sweating, feeling sick to your stomach (nauseous), or throw up (vomit).  Your pain becomes localized to the abdomen.  You develop any new symptoms that seem different or that concern you. MAKE SURE YOU:   Understand these instructions.  Will watch your condition.  Will get help right away if you are not doing well or get worse. Document Released: 02/08/2005 Document Revised: 05/03/2011 Document Reviewed: 10/13/2012 [facility] Patient Information 2015 [place], [place]. This information is not intended to replace advice given to you by your health care provider. Make sure you discuss any questions you have with your health care provider.

## 2014-04-17 NOTE — ED Provider Notes (Signed)
CSN: [id number]     Arrival date & time 04/17/14  1248 History   First MD Initiated Contact with Patient 04/17/14 1714     Chief Complaint  Patient presents with  . Leg Pain   Melanie Peters is a 44 y.o. female with a history of hypertension, diabetes and recent low back surgery who presents to the emergency room complaining of left lateral leg swelling and pain beginning 4 days ago. She rates her pain at 8 out of 10 and worse with walking. She also complains of some numbness in her left foot. Patient's taken no pain medicines today. The patient denies fevers, chest pain, shortness of breath, palpitations. The patient denies personal or family history of DVTs or PEs. The patient denies personal or family history of blood clotting disorders such as factor V Leiden, protein C or S deficiency. The patient has been less mobile recently due to recent back surgery.  (Consider location/radiation/quality/duration/timing/severity/associated sxs/prior Treatment) HPI  Past Medical History  Diagnosis Date  . Complication of anesthesia   . PONV (postoperative nausea and vomiting)   . Family history of adverse reaction to anesthesia     mother N/V  . Hypertension   . Pneumonia 03/01/14  . Diabetes mellitus without complication     on meds  . GERD (gastroesophageal reflux disease)   . History of hiatal hernia    Past Surgical History  Procedure Laterality Date  . Mandible fracture surgery Bilateral 1988  . Cholecystectomy  2007  . Cesarean section  2005, 2009  . Breast surgery  2005    cyst removal breast x2  . Back surgery  2011    lumbar  . Maximum access (mas)posterior lumbar interbody fusion (plif) 2 level N/A 04/03/2014    Procedure: FOR MAXIMUM ACCESS SURGERY (MAS) POSTERIOR LUMBAR INTERBODY FUSION LUMBAR FOUR - FIVE TO LUMBAR FIVE- SACRAL ONE;  Surgeon: Tia Alert, MD;  Location: [facility];  Service: Neurosurgery;  Laterality: N/A;   History reviewed. No pertinent family  history. History  Substance Use Topics  . Smoking status: Former Smoker -- 0.25 packs/day for 15 years    Types: Cigarettes    Quit date: 03/28/2003  . Smokeless tobacco: Never Used  . Alcohol Use: Yes     Comment: social   OB History    No data available     Review of Systems  Constitutional: Negative for fever and chills.  HENT: Negative for congestion and sore throat.   Eyes: Negative for visual disturbance.  Respiratory: Negative for cough, shortness of breath and wheezing.   Cardiovascular: Positive for leg swelling. Negative for chest pain and palpitations.  Gastrointestinal: Negative for nausea, vomiting, abdominal pain and diarrhea.  Genitourinary: Negative for dysuria.  Musculoskeletal: Negative for back pain and neck pain.       Left leg pain   Skin: Negative for rash.  Neurological: Positive for numbness. Negative for headaches.      Allergies  Oxycodone  Home Medications   Prior to Admission medications   Medication Sig Start Date End Date Taking? Authorizing Provider  esomeprazole (NEXIUM) 40 MG capsule Take 40 mg by mouth daily at 12 noon.   Yes Historical Provider, MD  hydrochlorothiazide (HYDRODIURIL) 25 MG tablet Take 25 mg by mouth daily.   Yes Historical Provider, MD  metFORMIN (GLUCOPHAGE) 500 MG tablet Take 500 mg by mouth 2 (two) times daily with a meal.   Yes Historical Provider, MD  methocarbamol (ROBAXIN) 500 MG tablet Take  1 tablet (500 mg total) by mouth every 6 (six) hours as needed for muscle spasms. 04/05/14  Yes Tia Alert, MD  HYDROcodone-acetaminophen (NORCO/VICODIN) 5-325 MG per tablet Take 1 tablet by mouth every 4 (four) hours as needed. 04/17/14   Einar Gip Hunner Garcon, PA-C   BP 152/89 mmHg  Pulse 85  Temp(Src) 98.3 F (36.8 C)  Resp 18  SpO2 98%  LMP 03/31/2014 Physical Exam  Constitutional: She appears well-developed and well-nourished. No distress.  HENT:  Head: Normocephalic and atraumatic.  Mouth/Throat: Oropharynx is  clear and moist. No oropharyngeal exudate.  Eyes: Conjunctivae are normal. Pupils are equal, round, and reactive to light. Right eye exhibits no discharge. Left eye exhibits no discharge.  Neck: Neck supple.  Cardiovascular: Normal rate, regular rhythm, normal heart sounds and intact distal pulses.  Exam reveals no gallop and no friction rub.   No murmur heard. Bilateral radial, posterior tibialis and dorsalis pedis pulses are intact.   Pulmonary/Chest: Effort normal and breath sounds normal. No respiratory distress. She has no wheezes. She has no rales.  Abdominal: Soft. There is no tenderness.  Musculoskeletal: Normal range of motion. She exhibits tenderness. She exhibits no edema.  Patient's left lateral leg is tender to palpation. No edema is appreciated. Bilateral posterior tibialis and dorsalis pedis pulses are intact. I do not appreciate any rash on her lower extremities.   Lymphadenopathy:    She has no cervical adenopathy.  Neurological: She is alert. Coordination normal.  Sensation is intact in her bilateral distal lower extremities.   Skin: Skin is warm and dry. She is not diaphoretic. No erythema. No pallor.  Psychiatric: She has a normal mood and affect. Her behavior is normal.  Nursing note and vitals reviewed.   ED Course  Procedures (including critical care time) Labs Review Labs Reviewed  I-STAT CHEM 8, ED - Abnormal; Notable for the following:    Glucose, Bld 120 (*)    All other components within normal limits    Imaging Review No results found.   EKG Interpretation None      Filed Vitals:   04/17/14 1306 04/17/14 1802 04/17/14 1930  BP: 152/90 137/84 152/89  Pulse: 88 81 85  Temp: 98.3 F (36.8 C)    Resp: SpO2: 98% 97% 98%     MDM   Meds given in ED:  Medications  acetaminophen (TYLENOL) tablet 500 mg (500 mg Oral Given 04/17/14 1832)    Discharge Medication List as of 04/17/2014  7:17 PM    START taking these medications   Details   HYDROcodone-acetaminophen (NORCO/VICODIN) 5-325 MG per tablet Take 1 tablet by mouth every 4 (four) hours as needed., Starting 04/17/2014, Until Discontinued, Print        Final diagnoses:  Left leg pain   This is a 44 y.o. female with a history of hypertension, diabetes and recent low back surgery who presents to the emergency room complaining of left lateral leg swelling and pain beginning 4 days ago. She denies chest pain, shortness of breath or palpitations. The patient is afebrile and nontoxic appearing. The patient is not hypoxic, tachypneic or tachycardic. I do not appreciate any lower extremity edema or erythema. The patient is tender over the lateral aspect of her left lower leg. She is neurovascularly intact. Doppler ultrasound ordered in triage is negative for DVT. The patient's pain is likely musculoskeletal related to her recent back surgery. The patient has a follow-up appointment with her neurosurgeon Dr. Onalee Hua  Jones in 5 days. I advised her to keep this appointment. The patient does not wish for narcotic pain medicine in the ED. Patient would like Norco for breakthrough pain at home. Advised her to use caution when taking Norco as it can make her drowsy. I advised her not to drive while taking Norco. I advised the patient to follow-up with their primary care provider this week. I advised the patient to return to the emergency department with new or worsening symptoms or new concerns. The patient verbalized understanding and agreement with plan.   This patient was discussed with Dr. Estell HarpinZammit who agrees with assessment and plan.     Lawana ChambersWilliam Duncan Zeola Brys, PA-C 04/18/14 0120  Benny LennertJoseph L Zammit, MD 04/19/14 [phone]

## 2014-04-17 NOTE — ED Notes (Signed)
Pt c/o left calf swelling with intermittent pain radiating into the foot since Sunday. Reports being able to bear weight on leg with intermittent foot pain. Pt had lumbar surgery two weeks ago; present in a lumbar brace. Pt also c/o rash.

## 2014-04-17 NOTE — ED Notes (Signed)
Pt here with left calf pain since Sunday and worsening. sts back surgery x 2 weeks ago.

## 2014-07-03 ENCOUNTER — Other Ambulatory Visit: Payer: Self-pay | Admitting: Gastroenterology

## 2014-07-03 DIAGNOSIS — R1013 Epigastric pain: Secondary | ICD-10-CM

## 2014-07-08 ENCOUNTER — Ambulatory Visit
Admission: RE | Admit: 2014-07-08 | Discharge: 2014-07-08 | Disposition: A | Discharge status: Home / Self Care | Payer: BLUE CROSS/BLUE SHIELD | Source: Ambulatory Visit | Attending: Gastroenterology | Admitting: Gastroenterology

## 2014-07-08 DIAGNOSIS — R1013 Epigastric pain: Secondary | ICD-10-CM

## 2014-07-09 ENCOUNTER — Other Ambulatory Visit: Payer: Self-pay | Admitting: Gastroenterology

## 2016-02-08 IMAGING — CR DG CHEST 2V
2 series · 2 of 2 positions shown · non-contrast
Comparison: 10/13/2004

CLINICAL DATA: Preop for L4-L5 fusion

EXAM:
CHEST  2 VIEW

[w chest pa]
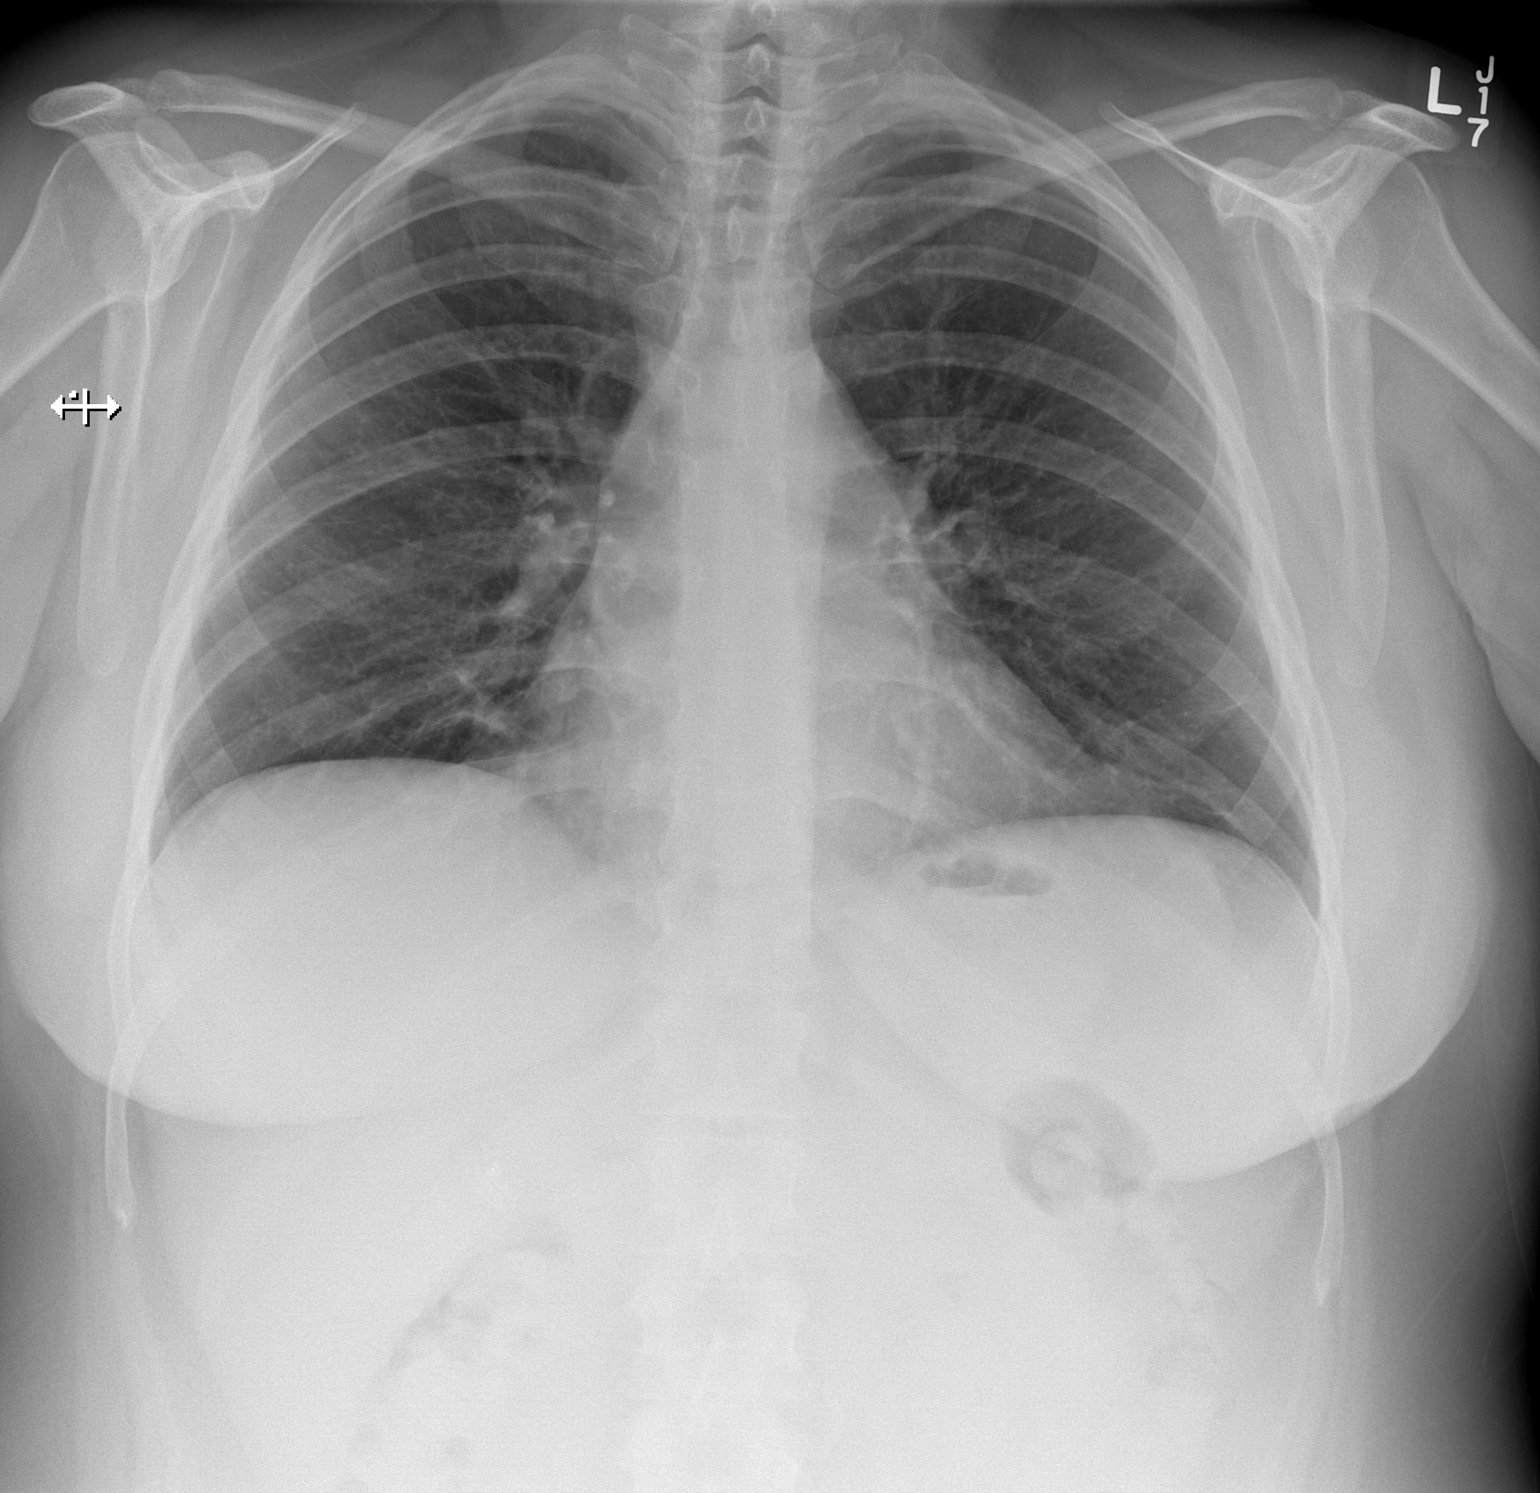

[w chest lat]
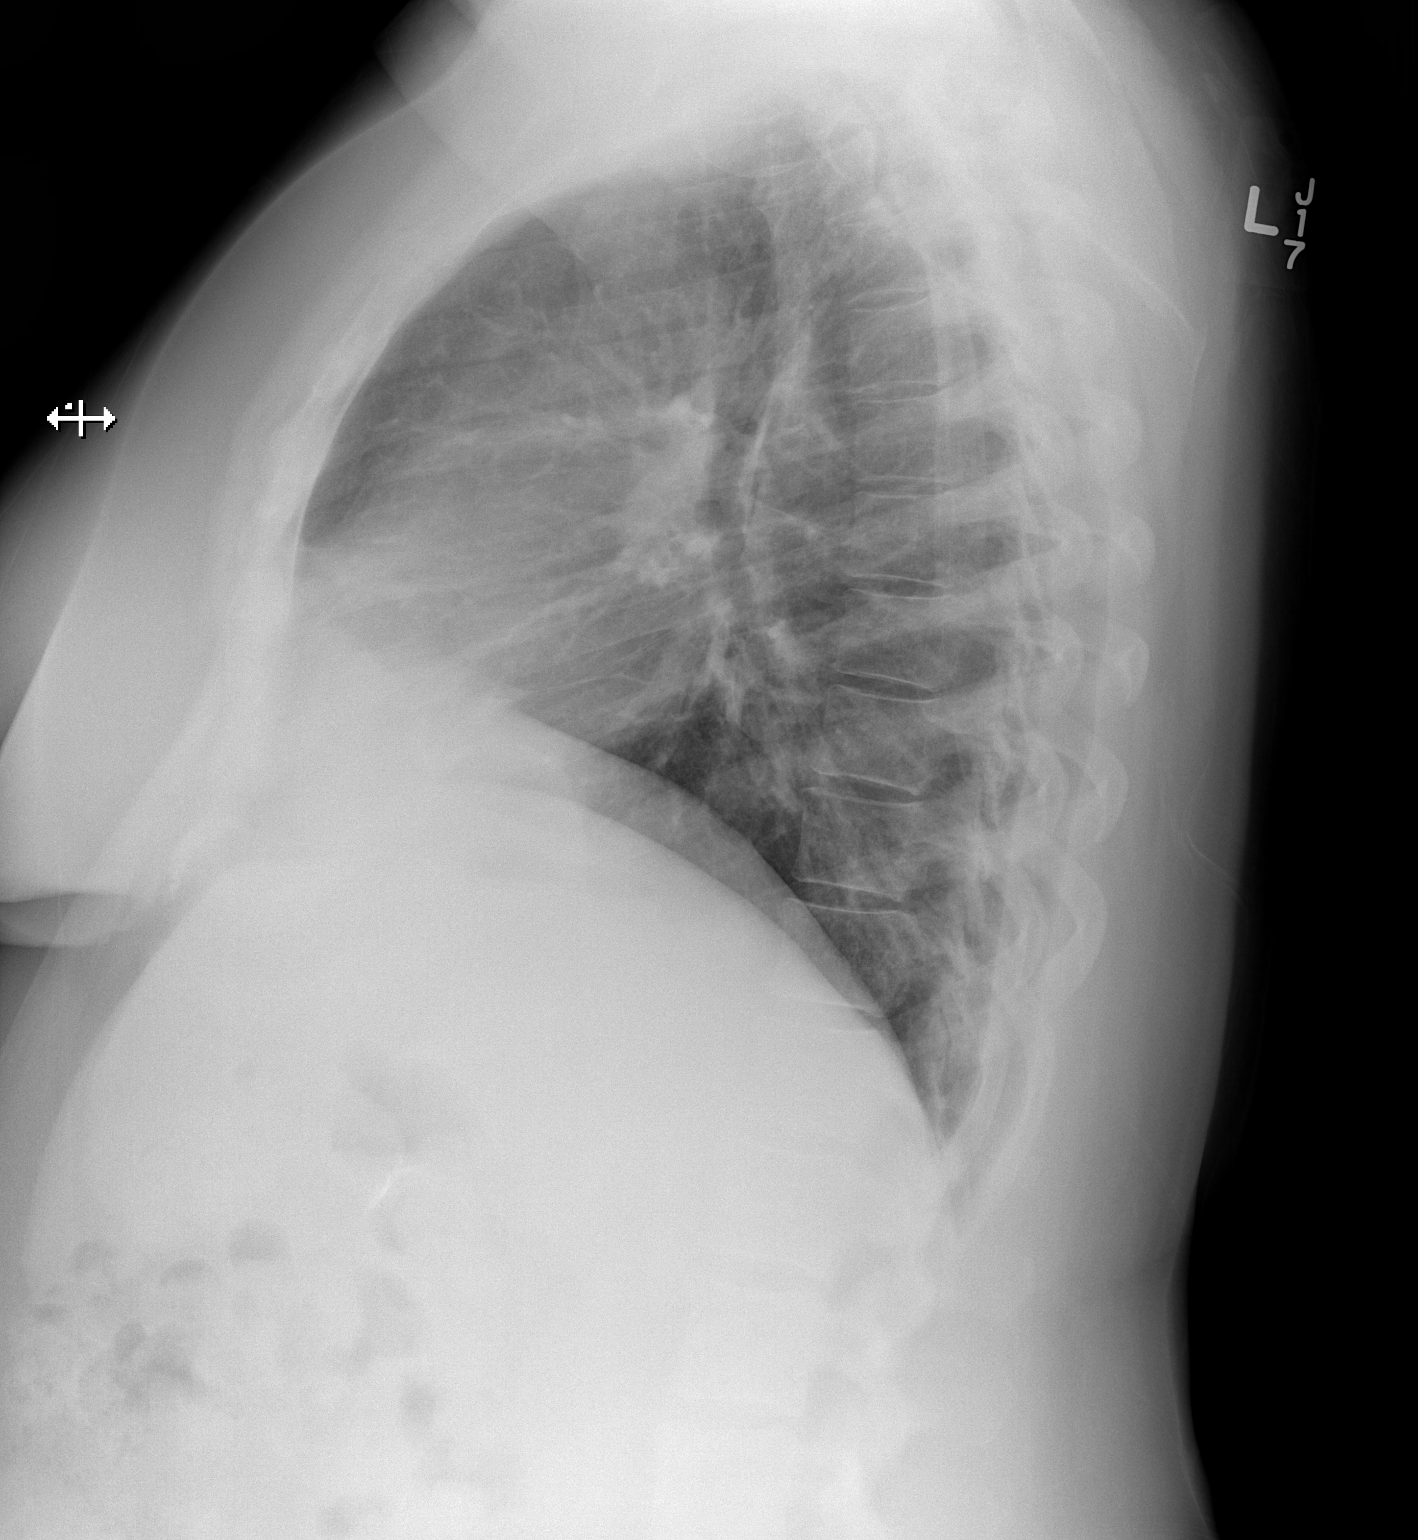

[2 of 2 positions shown; findings below may reference images not displayed]

FINDINGS: Cardiomediastinal silhouette is stable. No acute infiltrate or
pleural effusion. No pulmonary edema. Bony thorax is unremarkable.
IMPRESSION: No active cardiopulmonary disease.

## 2016-02-15 IMAGING — RF DG C-ARM 61-120 MIN
1 series · 3 of 3 positions shown · non-contrast
Comparison: MRI dated 01/04/2014

CLINICAL DATA: Lumbar disc protrusion.

EXAM:
LUMBAR SPINE - 2-3 VIEW; DG C-ARM 61-120 MIN

[Series 1: run · 3 of 3 slices shown]
[im 1/3]
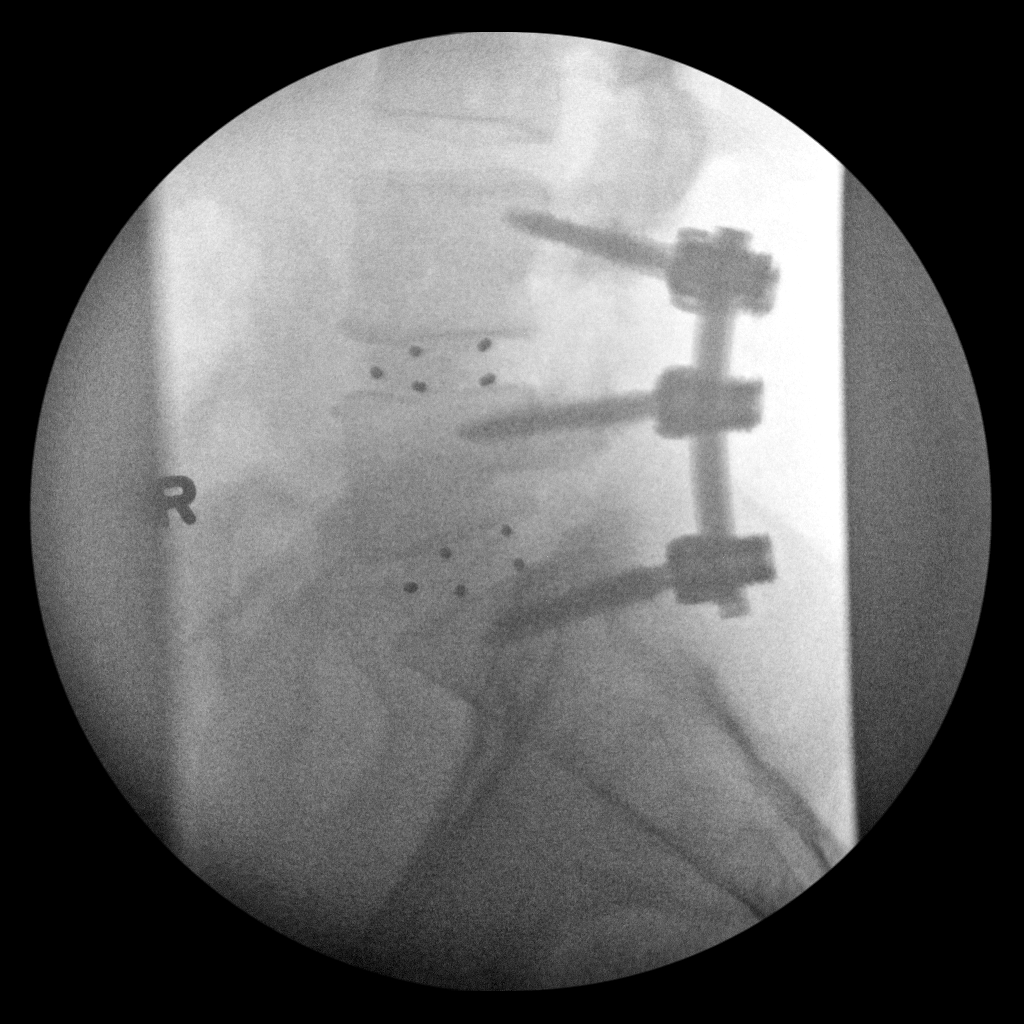
[im 2/3]
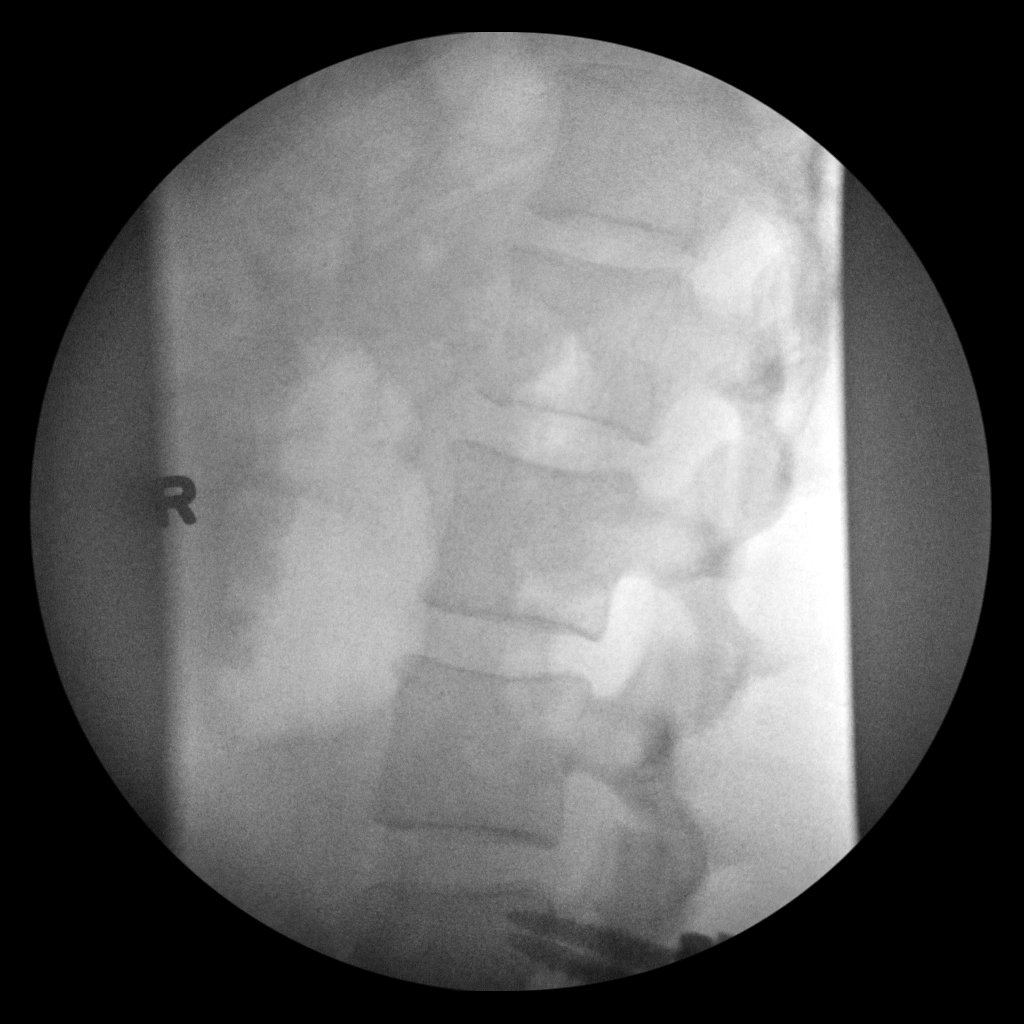
[im 3/3]
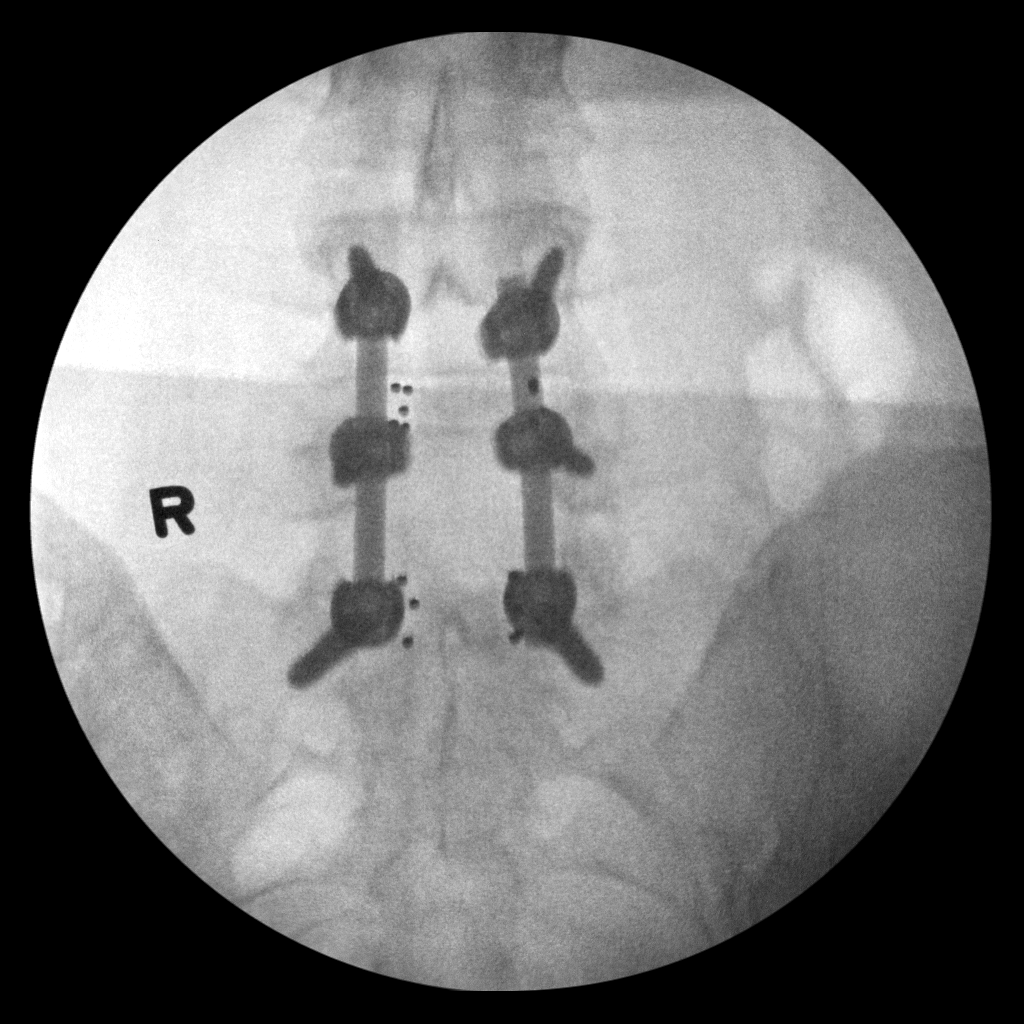

[3 of 3 positions shown; findings below may reference images not displayed]

FINDINGS: AP and lateral C-arm images demonstrate the patient has undergone
interbody and posterior fusion at L4-5 and L5-S1.
IMPRESSION: Lumbar fusions performed at L4-5 and L5-S1.  Alignment is anatomic.

## 2016-11-25 ENCOUNTER — Other Ambulatory Visit: Payer: Self-pay | Admitting: Student

## 2016-11-25 DIAGNOSIS — M5416 Radiculopathy, lumbar region: Secondary | ICD-10-CM

## 2016-12-09 ENCOUNTER — Ambulatory Visit
Admission: RE | Admit: 2016-12-09 | Discharge: 2016-12-09 | Disposition: A | Discharge status: Home / Self Care | Payer: BLUE CROSS/BLUE SHIELD | Source: Ambulatory Visit | Attending: Student | Admitting: Student

## 2016-12-09 DIAGNOSIS — M5416 Radiculopathy, lumbar region: Secondary | ICD-10-CM

## 2016-12-09 MED ORDER — GADOBENATE DIMEGLUMINE 529 MG/ML IV SOLN
20.0000 mL | Freq: Once | INTRAVENOUS | Status: AC | PRN
  Administered 2016-12-09: 20 mL via INTRAVENOUS

## 2016-12-25 DIAGNOSIS — R300 Dysuria: Secondary | ICD-10-CM | POA: Diagnosis not present

## 2016-12-25 DIAGNOSIS — N3091 Cystitis, unspecified with hematuria: Secondary | ICD-10-CM | POA: Diagnosis not present

## 2016-12-27 DIAGNOSIS — M5416 Radiculopathy, lumbar region: Secondary | ICD-10-CM | POA: Diagnosis not present

## 2017-01-04 DIAGNOSIS — Z6838 Body mass index (BMI) 38.0-38.9, adult: Secondary | ICD-10-CM | POA: Diagnosis not present

## 2017-01-04 DIAGNOSIS — E119 Type 2 diabetes mellitus without complications: Secondary | ICD-10-CM | POA: Diagnosis not present

## 2017-01-04 DIAGNOSIS — I10 Essential (primary) hypertension: Secondary | ICD-10-CM | POA: Diagnosis not present

## 2017-02-16 DIAGNOSIS — K219 Gastro-esophageal reflux disease without esophagitis: Secondary | ICD-10-CM | POA: Insufficient documentation

## 2017-02-16 DIAGNOSIS — E119 Type 2 diabetes mellitus without complications: Secondary | ICD-10-CM | POA: Insufficient documentation

## 2017-02-16 DIAGNOSIS — I10 Essential (primary) hypertension: Secondary | ICD-10-CM | POA: Insufficient documentation

## 2017-02-16 DIAGNOSIS — T4145XA Adverse effect of unspecified anesthetic, initial encounter: Secondary | ICD-10-CM | POA: Insufficient documentation

## 2017-02-16 DIAGNOSIS — Z8719 Personal history of other diseases of the digestive system: Secondary | ICD-10-CM | POA: Insufficient documentation

## 2017-02-16 DIAGNOSIS — T8859XA Other complications of anesthesia, initial encounter: Secondary | ICD-10-CM | POA: Insufficient documentation

## 2017-02-16 DIAGNOSIS — Z8489 Family history of other specified conditions: Secondary | ICD-10-CM | POA: Insufficient documentation

## 2017-02-16 DIAGNOSIS — R112 Nausea with vomiting, unspecified: Secondary | ICD-10-CM | POA: Insufficient documentation

## 2017-02-16 DIAGNOSIS — Z9889 Other specified postprocedural states: Secondary | ICD-10-CM

## 2017-03-09 ENCOUNTER — Ambulatory Visit: Payer: BLUE CROSS/BLUE SHIELD | Admitting: Interventional Cardiology

## 2017-03-13 ENCOUNTER — Encounter (HOSPITAL_COMMUNITY): Payer: Self-pay | Admitting: *Deleted

## 2017-03-13 ENCOUNTER — Other Ambulatory Visit: Payer: Self-pay

## 2017-03-13 ENCOUNTER — Ambulatory Visit (HOSPITAL_COMMUNITY)
Admission: EM | Admit: 2017-03-13 | Discharge: 2017-03-13 | Disposition: A | Discharge status: Home / Self Care | Payer: BLUE CROSS/BLUE SHIELD

## 2017-03-13 DIAGNOSIS — R6889 Other general symptoms and signs: Secondary | ICD-10-CM | POA: Diagnosis not present

## 2017-03-13 DIAGNOSIS — R51 Headache: Secondary | ICD-10-CM

## 2017-03-13 DIAGNOSIS — R110 Nausea: Secondary | ICD-10-CM

## 2017-03-13 DIAGNOSIS — R05 Cough: Secondary | ICD-10-CM

## 2017-03-13 DIAGNOSIS — R52 Pain, unspecified: Secondary | ICD-10-CM

## 2017-03-13 DIAGNOSIS — R059 Cough, unspecified: Secondary | ICD-10-CM

## 2017-03-13 DIAGNOSIS — R519 Headache, unspecified: Secondary | ICD-10-CM

## 2017-03-13 MED ORDER — AMOXICILLIN 500 MG PO CAPS: 500.0000 mg | ORAL_CAPSULE | Freq: Three times a day (TID) | ORAL | 0 refills | Status: AC

## 2017-03-13 MED ORDER — PSEUDOEPHEDRINE HCL ER 120 MG PO TB12: 120.0000 mg | ORAL_TABLET | Freq: Two times a day (BID) | ORAL | 3 refills | Status: AC

## 2017-03-13 NOTE — Discharge Instructions (Signed)
Hydrate very well with 2 liters of water daily. Try Sudafed for as a nasal decongestant. You may take 500mg  Tylenol with ibuprofen 400-600mg  every 6 hours for pain and inflammation.

## 2017-03-13 NOTE — ED Provider Notes (Signed)
MRN: [id number] DOB: 11-26-1970  Subjective:   Melanie Peters is a 47 y.o. female presenting for 4 day history of sinus headache, nasal congestion, mild sore throat, neck discomfort, now having body aches, chills, dry cough. Denies fever, ear pain, ear drainage, chest pain, shob, wheezing, n/v, abdominal pain. Denies smoking cigarettes.    Melanie Peters is allergic to oxycodone.  Melanie Peters  has a past medical history of Complication of anesthesia, Diabetes mellitus without complication (HCC), Family history of adverse reaction to anesthesia, GERD (gastroesophageal reflux disease), History of hiatal hernia, Hypertension, Pneumonia (03/01/14), and PONV (postoperative nausea and vomiting). Also  has a past surgical history that includes Mandible fracture surgery (Bilateral, 1988); Cholecystectomy (2007); Cesarean section (2005, 2009); Breast surgery (2005); Back surgery (2011); and Maximum access (mas)posterior lumbar interbody fusion (plif) 2 level (N/A, 04/03/2014). Reports family history of HTN in father, lung cancer in mother due to smoking, diabetes in father.  Objective:   Vitals: BP 117/74 (BP Location: Left Arm)   Pulse 73   Temp 98.2 F (36.8 C) (Oral)   LMP 03/08/2017 (Exact Date)   SpO2 100%   Physical Exam  Constitutional: She is oriented to person, place, and time. She appears well-developed and well-nourished.  HENT:  TM's intact bilaterally, no effusions or erythema. Nasal turbinates erythematous, dry, nasal passages patent. Mild bilateral maxillary and frontal sinus tenderness. Oropharynx with mild post-nasal drainage, mucous membranes moist.  Eyes: Pupils are equal, round, and reactive to light. Right eye exhibits no discharge. Left eye exhibits no discharge.  Neck: Normal range of motion. Neck supple.  Cardiovascular: Normal rate, regular rhythm and intact distal pulses. Exam reveals no gallop and no friction rub.  No murmur heard. Pulmonary/Chest: No respiratory distress. She has no  wheezes. She has no rales.  Lymphadenopathy:    She has cervical adenopathy.  Neurological: She is alert and oriented to person, place, and time.  Skin: Skin is warm and dry.  Psychiatric: She has a normal mood and affect.   Assessment and Plan :   Flu-like symptoms  Sinus headache  Body aches  Nausea without vomiting  Cough  Will manage for the flu with supportive care. Offered patient script for amoxicillin to address sinusitis if she has no improvement with supportive care.    Wallis Bamberg, PA-C 03/13/17 2225

## 2017-03-13 NOTE — ED Triage Notes (Signed)
Headaches that radiates to neck and back, sinus congestion, body aches, chills yesterday, coughing yesterday

## 2017-03-18 DIAGNOSIS — J019 Acute sinusitis, unspecified: Secondary | ICD-10-CM | POA: Diagnosis not present

## 2017-03-18 DIAGNOSIS — R590 Localized enlarged lymph nodes: Secondary | ICD-10-CM | POA: Diagnosis not present

## 2017-03-22 DIAGNOSIS — E119 Type 2 diabetes mellitus without complications: Secondary | ICD-10-CM | POA: Diagnosis not present

## 2017-03-22 DIAGNOSIS — I10 Essential (primary) hypertension: Secondary | ICD-10-CM | POA: Diagnosis not present

## 2017-03-22 DIAGNOSIS — E7849 Other hyperlipidemia: Secondary | ICD-10-CM | POA: Diagnosis not present

## 2017-03-22 DIAGNOSIS — R590 Localized enlarged lymph nodes: Secondary | ICD-10-CM | POA: Diagnosis not present

## 2017-05-12 ENCOUNTER — Ambulatory Visit (INDEPENDENT_AMBULATORY_CARE_PROVIDER_SITE_OTHER): Payer: BLUE CROSS/BLUE SHIELD

## 2017-05-12 ENCOUNTER — Encounter: Payer: Self-pay | Admitting: Podiatry

## 2017-05-12 ENCOUNTER — Ambulatory Visit: Payer: BLUE CROSS/BLUE SHIELD | Admitting: Podiatry

## 2017-05-12 DIAGNOSIS — B070 Plantar wart: Secondary | ICD-10-CM

## 2017-05-12 DIAGNOSIS — M779 Enthesopathy, unspecified: Secondary | ICD-10-CM

## 2017-05-12 DIAGNOSIS — M722 Plantar fascial fibromatosis: Secondary | ICD-10-CM | POA: Diagnosis not present

## 2017-05-12 NOTE — Patient Instructions (Addendum)
Take dressing off in 8 hours and wash the foot with soap and water. If it is hurting or becomes uncomfortable before the 8 hours, go ahead and remove the bandage and wash the area.  If it blisters, apply antibiotic ointment and a band-aid.  Monitor for any signs/symptoms of infection. Call the office immediately if any occur or go directly to the emergency room. Call with any questions/concerns.   Plantar Fasciitis Rehab Ask your health care provider which exercises are safe for you. Do exercises exactly as told by your health care provider and adjust them as directed. It is normal to feel mild stretching, pulling, tightness, or discomfort as you do these exercises, but you should stop right away if you feel sudden pain or your pain gets worse. Do not begin these exercises until told by your health care provider. Stretching and range of motion exercises These exercises warm up your muscles and joints and improve the movement and flexibility of your foot. These exercises also help to relieve pain. Exercise A: Plantar fascia stretch  1. Sit with your left / right leg crossed over your opposite knee. 2. Hold your heel with one hand with that thumb near your arch. With your other hand, hold your toes and gently pull them back toward the top of your foot. You should feel a stretch on the bottom of your toes or your foot or both. 3. Hold this stretch for__________ seconds. 4. Slowly release your toes and return to the starting position. Repeat __________ times. Complete this exercise __________ times a day. Exercise B: Gastroc, standing  1. Stand with your hands against a wall. 2. Extend your left / right leg behind you, and bend your front knee slightly. 3. Keeping your heels on the floor and keeping your back knee straight, shift your weight toward the wall without arching your back. You should feel a gentle stretch in your left / right calf. 4. Hold this position for __________ seconds. Repeat  __________ times. Complete this exercise __________ times a day. Exercise C: Soleus, standing 1. Stand with your hands against a wall. 2. Extend your left / right leg behind you, and bend your front knee slightly. 3. Keeping your heels on the floor, bend your back knee and slightly shift your weight over the back leg. You should feel a gentle stretch deep in your calf. 4. Hold this position for __________ seconds. Repeat __________ times. Complete this exercise __________ times a day. Exercise D: Gastrocsoleus, standing 1. Stand with the ball of your left / right foot on a step. The ball of your foot is on the walking surface, right under your toes. 2. Keep your other foot firmly on the same step. 3. Hold onto the wall or a railing for balance. 4. Slowly lift your other foot, allowing your body weight to press your heel down over the edge of the step. You should feel a stretch in your left / right calf. 5. Hold this position for __________ seconds. 6. Return both feet to the step. 7. Repeat this exercise with a slight bend in your left / right knee. Repeat __________ times with your left / right knee straight and __________ times with your left / right knee bent. Complete this exercise __________ times a day. Balance exercise This exercise builds your balance and strength control of your arch to help take pressure off your plantar fascia. Exercise E: Single leg stand 1. Without shoes, stand near a railing or in a doorway. You  may hold onto the railing or door frame as needed. 2. Stand on your left / right foot. Keep your big toe down on the floor and try to keep your arch lifted. Do not let your foot roll inward. 3. Hold this position for __________ seconds. 4. If this exercise is too easy, you can try it with your eyes closed or while standing on a pillow. Repeat __________ times. Complete this exercise __________ times a day. This information is not intended to replace advice given to you  by your health care provider. Make sure you discuss any questions you have with your health care provider. Document Released: 02/08/2005 Document Revised: 10/14/2015 Document Reviewed: 12/23/2014 Elsevier Interactive Patient Education  2018 [employer]

## 2017-05-16 ENCOUNTER — Ambulatory Visit (INDEPENDENT_AMBULATORY_CARE_PROVIDER_SITE_OTHER): Payer: BLUE CROSS/BLUE SHIELD

## 2017-05-16 DIAGNOSIS — M79676 Pain in unspecified toe(s): Secondary | ICD-10-CM

## 2017-05-16 DIAGNOSIS — M722 Plantar fascial fibromatosis: Secondary | ICD-10-CM

## 2017-05-16 NOTE — Progress Notes (Signed)
Subjective:   Patient ID: Melanie Peters, female   DOB: 47 y.o.   MRN: [id number]   HPI Melanie Peters presents the office today for concerns of a wart to her left foot which is been ongoing for several months.  She has tried numerous over-the-counter treatments including Compound W, duct tape but any significant improvement.  She also states that she has a history of plantar fasciitis and she had inserts previously.  She states the left side is worse than the right.  No recent injury or trauma.  No swelling or redness.  The pain does not wake up at night.  No other concerns today.   Review of Systems  All other systems reviewed and are negative.  Past Medical History:  Diagnosis Date  . Complication of anesthesia   . Diabetes mellitus without complication (HCC)    on meds  . Family history of adverse reaction to anesthesia    mother N/V  . GERD (gastroesophageal reflux disease)   . History of hiatal hernia   . Hypertension   . Pneumonia 03/01/14  . PONV (postoperative nausea and vomiting)     Past Surgical History:  Procedure Laterality Date  . BACK SURGERY  2011   lumbar  . BREAST SURGERY  2005   cyst removal breast x2  . CESAREAN SECTION  2005, 2009  . CHOLECYSTECTOMY  2007  . MANDIBLE FRACTURE SURGERY Bilateral 1988  . MAXIMUM ACCESS (MAS)POSTERIOR LUMBAR INTERBODY FUSION (PLIF) 2 LEVEL N/A 04/03/2014   Procedure: FOR MAXIMUM ACCESS SURGERY (MAS) POSTERIOR LUMBAR INTERBODY FUSION LUMBAR FOUR - FIVE TO LUMBAR FIVE- SACRAL ONE;  Surgeon: Tia Alertavid S Jones, MD;  Location: [facility];  Service: Neurosurgery;  Laterality: N/A;     Current Outpatient Medications:  .  amoxicillin (AMOXIL) 500 MG capsule, Take 1 capsule (500 mg total) by mouth 3 (three) times daily., Disp: 21 capsule, Rfl: 0 .  esomeprazole (NEXIUM) 40 MG capsule, Take 40 mg by mouth daily at 12 noon., Disp: , Rfl:  .  hydrochlorothiazide (HYDRODIURIL) 25 MG tablet, Take 25 mg by mouth daily., Disp: , Rfl:  .   HYDROcodone-acetaminophen (NORCO/VICODIN) 5-325 MG per tablet, Take 1 tablet by mouth every 4 (four) hours as needed., Disp: 15 tablet, Rfl: 0 .  metFORMIN (GLUCOPHAGE) 1000 MG tablet, Take 1,000 mg by mouth 2 (two) times daily with a meal., Disp: , Rfl:  .  metFORMIN (GLUCOPHAGE) 500 MG tablet, Take 500 mg by mouth 2 (two) times daily with a meal., Disp: , Rfl:  .  methocarbamol (ROBAXIN) 500 MG tablet, Take 1 tablet (500 mg total) by mouth every 6 (six) hours as needed for muscle spasms., Disp: 60 tablet, Rfl: 1 .  omeprazole (PRILOSEC) 40 MG capsule, Take 40 mg by mouth daily., Disp: , Rfl:  .  pseudoephedrine (SUDAFED 12 HOUR) 120 MG 12 hr tablet, Take 1 tablet (120 mg total) by mouth 2 (two) times daily., Disp: 30 tablet, Rfl: 3  Allergies  Allergen Reactions  . Oxycodone Itching    Social History   Socioeconomic History  . Marital status: Married    Spouse name: Not on file  . Number of children: Not on file  . Years of education: Not on file  . Highest education level: Not on file  Occupational History  . Not on file  Social Needs  . Financial resource strain: Not on file  . Food insecurity:    Worry: Not on file    Inability: Not on file  .  Transportation needs:    Medical: Not on file    Non-medical: Not on file  Tobacco Use  . Smoking status: Former Smoker    Packs/day: 0.25    Years: 15.00    Pack years: 3.75    Types: Cigarettes    Last attempt to quit: 03/28/2003    Years since quitting: 14.1  . Smokeless tobacco: Never Used  Substance and Sexual Activity  . Alcohol use: Yes    Comment: social  . Drug use: No  . Sexual activity: Not on file  Lifestyle  . Physical activity:    Days per week: Not on file    Minutes per session: Not on file  . Stress: Not on file  Relationships  . Social connections:    Talks on phone: Not on file    Gets together: Not on file    Attends religious service: Not on file    Active member of club or organization: Not on file     Attends meetings of clubs or organizations: Not on file    Relationship status: Not on file  . Intimate partner violence:    Fear of current or ex partner: Not on file    Emotionally abused: Not on file    Physically abused: Not on file    Forced sexual activity: Not on file  Other Topics Concern  . Not on file  Social History Narrative  . Not on file         Objective:  Physical Exam  General: AAO x3, NAD  Dermatological: On the plantar aspect the left arch of the foot there is a hyperkeratotic lesion upon debridement there is pinpoint bleeding and evidence of verruca.  No surrounding erythema, ascending cellulitis or any drainage or pus or any clinical signs of infection.  Vascular: Dorsalis Pedis artery and Posterior Tibial artery pedal pulses are 2/4 bilateral with immedate capillary fill time.  There is no pain with calf compression, swelling, warmth, erythema.   Neruologic: Grossly intact via light touch bilateral. Vibratory intact via tuning fork bilateral. Protective threshold with Semmes Wienstein monofilament intact to all pedal sites bilateral. Negative tinel sign.   Musculoskeletal: There is mild tenderness to palpation along the plantar medial tubercle of the calcaneus at the insertion of plantar fascia on the left > right foot. There is no pain along the course of the plantar fascia within the arch of the foot. Plantar fascia appears to be intact. There is no pain with lateral compression of the calcaneus or pain with vibratory sensation. There is no pain along the course or insertion of the achilles tendon. No other areas of tenderness to bilateral lower extremities. Muscular strength 5/5 in all groups tested bilateral.  Gait: Unassisted, Nonantalgic.     Assessment:   Left foot plantar wart with bilateral plantar fasciitis left side worse than right     Plan:  -Treatment options discussed including all alternatives, risks, and complications -Etiology of  symptoms were discussed -X-rays were obtained and reviewed with the patient. No evidence of acute fracture or stress fracture identified today -Hyperkeratotic lesion was sharply debrided in the left foot without any complications.  The area was cleaned with alcohol and Cantharone was applied followed by an occlusive bandage.  Post procedure instructions were discussed.  Monitor for infection. -Regards to plantar fasciitis discussed stretching, icing exercises daily.  We discussed new orthotics. -RTC 3 weeks or sooner if needed.  Vivi Barrack DPM

## 2017-05-23 ENCOUNTER — Ambulatory Visit (INDEPENDENT_AMBULATORY_CARE_PROVIDER_SITE_OTHER): Payer: BLUE CROSS/BLUE SHIELD

## 2017-05-23 DIAGNOSIS — M722 Plantar fascial fibromatosis: Secondary | ICD-10-CM

## 2017-05-23 DIAGNOSIS — M79676 Pain in unspecified toe(s): Secondary | ICD-10-CM

## 2017-05-24 NOTE — Progress Notes (Signed)
Patient presents with pain in bilateral heel/feet left over right. She currently has a plantar wart that is being treated by Dr Ardelle AntonWagoner that is very painful to the touch.  Pain on palpation to right heel, upward into the arch area towrd forefoot as well. Her pain is worse in her left foot but due to location of the plantar wart, we are going to hold off on shockwave treatment at this time.   ESWT administered to right foot/heel and tolerated well for 5 joules. EPAT administered to entire plantar fascia. She is to follow up in 1 week for 2nd treatment

## 2017-05-26 ENCOUNTER — Encounter: Payer: Self-pay | Admitting: Podiatry

## 2017-05-26 ENCOUNTER — Ambulatory Visit: Payer: BLUE CROSS/BLUE SHIELD | Admitting: Podiatry

## 2017-05-26 DIAGNOSIS — B070 Plantar wart: Secondary | ICD-10-CM | POA: Diagnosis not present

## 2017-05-26 NOTE — Patient Instructions (Signed)
Take dressing off in 8 hours and wash the foot with soap and water. If it is hurting or becomes uncomfortable before the 8 hours, go ahead and remove the bandage and wash the area.  If it blisters, apply antibiotic ointment and a band-aid.  Monitor for any signs/symptoms of infection. Call the office immediately if any occur or go directly to the emergency room. Call with any questions/concerns.   

## 2017-05-27 DIAGNOSIS — B070 Plantar wart: Secondary | ICD-10-CM | POA: Insufficient documentation

## 2017-05-27 NOTE — Progress Notes (Signed)
Subjective: Melanie Peters presents the office today for follow-up evaluation of a wart to the left foot.  She states that she cannot tell a difference at this point and she had no complications after last treatment.  She states that the treatment for the right foot is doing very well she is actually not had any pain to the right foot which she is very pleased about.  She is eager to get started on the EPAT treatment on the left.  Denies any systemic complaints such as fevers, chills, nausea, vomiting. No acute changes since last appointment, and no other complaints at this time.   Objective: AAO x3, NAD DP/PT pulses palpable bilaterally, CRT less than 3 seconds On the left foot on the arch of the foot secondary to a hyperkeratotic lesion.  Upon debridement there is pinpoint bleeding and evidence of verruca.  He does appear to be more superficial compared to last appointment.  There is no surrounding erythema, ascending cellulitis.  There is no fluctuation or crepitation.  There is no malodor.  Otherwise plantar fascia continues to be tender and there is no change to that. She has a boot on the right foot she states that she is doing very well on the side no pain. No open lesions or pre-ulcerative lesions.  No pain with calf compression, swelling, warmth, erythema  Assessment: Verruca left foot  Plan: -All treatment options discussed with the patient including all alternatives, risks, complications.  -I was able to debride the area today without any complications.  The area was cleaned with alcohol.  Cantharone was applied followed by an occlusive bandage.  Post procedure instructions were discussed.  Monitor for any signs or symptoms of infection. -Although she her pain is much improved to the right side we will continue the third EPAT treatment next week.  I will try to see her to see how the left foot is doing when she comes in.  -Patient encouraged to call the office with any questions, concerns, change  in symptoms.   Vivi BarrackMatthew R Iyah Laguna DPM

## 2017-05-30 ENCOUNTER — Other Ambulatory Visit: Payer: BLUE CROSS/BLUE SHIELD

## 2017-06-01 NOTE — Progress Notes (Signed)
Patient presents with pain in bilateral heel/feet left over right. She currently has a plantar wart that is being treated by Dr Ardelle AntonWagoner that is very painful to the touch. She states that her right foot feels much better since her last treatment  Pain on palpation to right heel, upward into the arch area towrd forefoot as well. Her pain is worse in her left foot but due to location of the plantar wart, we are going to hold off on shockwave treatment at this time.   ESWT administered to right foot/heel  At 7.5 joulesand tolerated well for 5 joules. EPAT administered to entire plantar fascia. She is to follow up in 1 week for 3rd treatment

## 2017-06-06 ENCOUNTER — Ambulatory Visit: Payer: Self-pay | Admitting: Podiatry

## 2017-06-06 ENCOUNTER — Encounter: Payer: Self-pay | Admitting: Podiatry

## 2017-06-06 DIAGNOSIS — B070 Plantar wart: Secondary | ICD-10-CM | POA: Diagnosis not present

## 2017-06-06 DIAGNOSIS — M79676 Pain in unspecified toe(s): Secondary | ICD-10-CM

## 2017-06-06 DIAGNOSIS — M722 Plantar fascial fibromatosis: Secondary | ICD-10-CM

## 2017-06-07 NOTE — Progress Notes (Signed)
Subjective: Melanie Peters presents the office today for follow-up evaluation of a wart to the left foot as well as for her third EPAT treatment on her right foot.  She says her right foot is doing very well she is in very minimal pain.  The left foot is also improving.  She had no complications after last Cantharone application.  Denies any swelling or redness.  Still has some mild discomfort but overall improved. Denies any systemic complaints such as fevers, chills, nausea, vomiting. No acute changes since last appointment, and no other complaints at this time.   Objective: AAO x3, NAD DP/PT pulses palpable bilaterally, CRT less than 3 seconds On the arch of the left foot is a hyperkeratotic lesion.  Upon debridement there is still some evidence of verruca present however this is much improved.  Mild tenderness palpation but improved.  No swelling redness or drainage or any clinical signs of infection.  There is still tenderness palpation on the plantar medial tubercle of the calcaneus at the insertion of the plantar fascia on the left foot.  Minimal discomfort in the right foot.  No other area tenderness. No open lesions or pre-ulcerative lesions.  No pain with calf compression, swelling, warmth, erythema  Assessment: Resolving verruca left foot, plantar fasciitis bilaterally  Plan: -All treatment options discussed with the patient including all alternatives, risks, complications.  -Lesion was sharply debrided today in the left foot without any complications or bleeding.  The area was cleaned with alcohol followed by Cantharone and occlusive bandage.  Post procedure instructions were discussed.  Months for infection. -Today she had her third EPAT treatment without any complications performed by Lahoma CrockerJessica Q, RN.  She is doing well we will hold off on any further treatment of right foot and likely I will see her back in 2 weeks to start treatment on the left foot. -Patient encouraged to call the office with  any questions, concerns, change in symptoms.   Vivi BarrackMatthew R Maudine Kluesner DPM

## 2017-06-20 ENCOUNTER — Ambulatory Visit: Payer: BLUE CROSS/BLUE SHIELD

## 2017-06-20 DIAGNOSIS — M722 Plantar fascial fibromatosis: Secondary | ICD-10-CM

## 2017-06-20 DIAGNOSIS — B351 Tinea unguium: Secondary | ICD-10-CM

## 2017-06-22 DIAGNOSIS — N920 Excessive and frequent menstruation with regular cycle: Secondary | ICD-10-CM | POA: Insufficient documentation

## 2017-06-22 DIAGNOSIS — E669 Obesity, unspecified: Secondary | ICD-10-CM | POA: Insufficient documentation

## 2017-06-22 DIAGNOSIS — N943 Premenstrual tension syndrome: Secondary | ICD-10-CM | POA: Insufficient documentation

## 2017-06-22 NOTE — Progress Notes (Signed)
Patient presents with pain in her left foot for which she has tried various treatments with no success. Previous plantar wart seems to be resolving at this time,   Pain on palpation to left foot up arch , upward into the arch area toward forefoot as well.   ESWT administered to left foot/heel for 3 joules. EPAT administered to entire plantar fascia. She is to follow up in 2 weeks for 2nd treatment to left foot.

## 2017-07-04 ENCOUNTER — Ambulatory Visit (INDEPENDENT_AMBULATORY_CARE_PROVIDER_SITE_OTHER): Payer: BLUE CROSS/BLUE SHIELD

## 2017-07-04 DIAGNOSIS — M722 Plantar fascial fibromatosis: Secondary | ICD-10-CM

## 2017-07-04 DIAGNOSIS — M79676 Pain in unspecified toe(s): Secondary | ICD-10-CM

## 2017-07-11 ENCOUNTER — Ambulatory Visit (INDEPENDENT_AMBULATORY_CARE_PROVIDER_SITE_OTHER): Payer: BLUE CROSS/BLUE SHIELD

## 2017-07-11 DIAGNOSIS — M79676 Pain in unspecified toe(s): Secondary | ICD-10-CM

## 2017-07-11 DIAGNOSIS — M722 Plantar fascial fibromatosis: Secondary | ICD-10-CM

## 2017-07-11 NOTE — Progress Notes (Signed)
Patient presents with pain in her left foot for which she has tried various treatments with no success. Previous plantar wart seems to be resolving at this time. She does state that her foot is improving  Pain on palpation to left foot up arch , upward into the arch area toward forefoot as well.   ESWT administered to left foot/heel for 3.8 joules. EPAT administered to entire plantar fascia. She is to follow up in 1 weeks for 4th treatment to left foot.

## 2017-07-15 NOTE — Progress Notes (Signed)
Patient presents with pain in her left foot for which she has tried various treatments with no success. She says that the pain in her right foot has resolved, and the pain in her left foot is improving  Pain on palpation to left foot up arch , upward into the arch area toward forefoot as well.   ESWT administered to left foot/heel for 3.8 joules. EPAT administered to entire plantar fascia. She is to follow up in 1 weeks for 4th treatment to left foot.

## 2017-07-21 DIAGNOSIS — D508 Other iron deficiency anemias: Secondary | ICD-10-CM | POA: Diagnosis not present

## 2017-07-21 DIAGNOSIS — E119 Type 2 diabetes mellitus without complications: Secondary | ICD-10-CM | POA: Diagnosis not present

## 2017-07-21 DIAGNOSIS — I10 Essential (primary) hypertension: Secondary | ICD-10-CM | POA: Diagnosis not present

## 2017-07-21 DIAGNOSIS — E7849 Other hyperlipidemia: Secondary | ICD-10-CM | POA: Diagnosis not present

## 2017-07-21 DIAGNOSIS — L659 Nonscarring hair loss, unspecified: Secondary | ICD-10-CM | POA: Diagnosis not present

## 2017-07-25 ENCOUNTER — Other Ambulatory Visit: Payer: BLUE CROSS/BLUE SHIELD

## 2017-07-29 ENCOUNTER — Other Ambulatory Visit: Payer: BLUE CROSS/BLUE SHIELD

## 2017-08-02 ENCOUNTER — Other Ambulatory Visit: Payer: BLUE CROSS/BLUE SHIELD

## 2017-08-05 ENCOUNTER — Ambulatory Visit (INDEPENDENT_AMBULATORY_CARE_PROVIDER_SITE_OTHER): Payer: BLUE CROSS/BLUE SHIELD

## 2017-08-05 DIAGNOSIS — M79676 Pain in unspecified toe(s): Secondary | ICD-10-CM

## 2017-08-05 DIAGNOSIS — M722 Plantar fascial fibromatosis: Secondary | ICD-10-CM

## 2017-08-11 NOTE — Progress Notes (Signed)
Patient presents with pain in her left foot for which she has tried various treatments with no success. She says that the pain in her right foot has resolved, and the pain in her left foot is a lot better, and that she continues to wear her boot.   Pain on palpation to left foot up arch , upward into the arch area toward forefoot as well.   ESWT administered to left foot/heel for 4.5 joules. EPAT administered to entire plantar fascia. She is to follow up in 6 weeks for evaluation with Dr Ardelle AntonWagoner if needed

## 2017-09-16 DIAGNOSIS — Z6830 Body mass index (BMI) 30.0-30.9, adult: Secondary | ICD-10-CM | POA: Diagnosis not present

## 2017-09-16 DIAGNOSIS — Z1231 Encounter for screening mammogram for malignant neoplasm of breast: Secondary | ICD-10-CM | POA: Diagnosis not present

## 2017-09-16 DIAGNOSIS — Z01419 Encounter for gynecological examination (general) (routine) without abnormal findings: Secondary | ICD-10-CM | POA: Diagnosis not present

## 2017-09-16 DIAGNOSIS — Z1389 Encounter for screening for other disorder: Secondary | ICD-10-CM | POA: Diagnosis not present

## 2017-09-16 DIAGNOSIS — Z130 Encounter for screening for diseases of the blood and blood-forming organs and certain disorders involving the immune mechanism: Secondary | ICD-10-CM | POA: Diagnosis not present

## 2017-09-19 ENCOUNTER — Ambulatory Visit: Payer: BLUE CROSS/BLUE SHIELD | Admitting: Podiatry

## 2017-09-27 ENCOUNTER — Other Ambulatory Visit: Payer: Self-pay | Admitting: Obstetrics and Gynecology

## 2017-09-27 DIAGNOSIS — R928 Other abnormal and inconclusive findings on diagnostic imaging of breast: Secondary | ICD-10-CM

## 2017-09-27 DIAGNOSIS — N6489 Other specified disorders of breast: Secondary | ICD-10-CM

## 2017-09-30 ENCOUNTER — Ambulatory Visit
Admission: RE | Admit: 2017-09-30 | Discharge: 2017-09-30 | Disposition: A | Discharge status: Home / Self Care | Payer: BLUE CROSS/BLUE SHIELD | Source: Ambulatory Visit | Attending: Obstetrics and Gynecology | Admitting: Obstetrics and Gynecology

## 2017-09-30 ENCOUNTER — Ambulatory Visit: Payer: BLUE CROSS/BLUE SHIELD

## 2017-09-30 DIAGNOSIS — R928 Other abnormal and inconclusive findings on diagnostic imaging of breast: Secondary | ICD-10-CM

## 2017-10-03 DIAGNOSIS — E119 Type 2 diabetes mellitus without complications: Secondary | ICD-10-CM | POA: Diagnosis not present

## 2017-10-06 ENCOUNTER — Ambulatory Visit: Payer: BLUE CROSS/BLUE SHIELD | Admitting: Podiatry

## 2017-10-26 DIAGNOSIS — S01301A Unspecified open wound of right ear, initial encounter: Secondary | ICD-10-CM | POA: Diagnosis not present

## 2017-10-26 DIAGNOSIS — H6041 Cholesteatoma of right external ear: Secondary | ICD-10-CM | POA: Diagnosis not present

## 2017-10-27 ENCOUNTER — Ambulatory Visit: Payer: BLUE CROSS/BLUE SHIELD | Admitting: Podiatry

## 2017-11-14 DIAGNOSIS — E119 Type 2 diabetes mellitus without complications: Secondary | ICD-10-CM | POA: Diagnosis not present

## 2017-11-14 DIAGNOSIS — L659 Nonscarring hair loss, unspecified: Secondary | ICD-10-CM | POA: Diagnosis not present

## 2017-11-14 DIAGNOSIS — I10 Essential (primary) hypertension: Secondary | ICD-10-CM | POA: Diagnosis not present

## 2017-11-14 DIAGNOSIS — Z0000 Encounter for general adult medical examination without abnormal findings: Secondary | ICD-10-CM | POA: Diagnosis not present

## 2017-11-14 DIAGNOSIS — R82998 Other abnormal findings in urine: Secondary | ICD-10-CM | POA: Diagnosis not present

## 2017-11-21 DIAGNOSIS — E611 Iron deficiency: Secondary | ICD-10-CM | POA: Diagnosis not present

## 2017-11-21 DIAGNOSIS — E7849 Other hyperlipidemia: Secondary | ICD-10-CM | POA: Diagnosis not present

## 2017-11-21 DIAGNOSIS — Z23 Encounter for immunization: Secondary | ICD-10-CM | POA: Diagnosis not present

## 2017-11-21 DIAGNOSIS — Z0000 Encounter for general adult medical examination without abnormal findings: Secondary | ICD-10-CM | POA: Diagnosis not present

## 2017-11-21 DIAGNOSIS — E119 Type 2 diabetes mellitus without complications: Secondary | ICD-10-CM | POA: Diagnosis not present

## 2017-11-21 DIAGNOSIS — I10 Essential (primary) hypertension: Secondary | ICD-10-CM | POA: Diagnosis not present

## 2017-11-21 DIAGNOSIS — Z1389 Encounter for screening for other disorder: Secondary | ICD-10-CM | POA: Diagnosis not present

## 2017-12-13 DIAGNOSIS — H6983 Other specified disorders of Eustachian tube, bilateral: Secondary | ICD-10-CM | POA: Diagnosis not present

## 2018-01-06 DIAGNOSIS — Z1212 Encounter for screening for malignant neoplasm of rectum: Secondary | ICD-10-CM | POA: Diagnosis not present

## 2018-03-09 DIAGNOSIS — E611 Iron deficiency: Secondary | ICD-10-CM | POA: Diagnosis not present

## 2018-05-22 DIAGNOSIS — E785 Hyperlipidemia, unspecified: Secondary | ICD-10-CM | POA: Diagnosis not present

## 2018-05-22 DIAGNOSIS — I10 Essential (primary) hypertension: Secondary | ICD-10-CM | POA: Diagnosis not present

## 2018-05-22 DIAGNOSIS — E611 Iron deficiency: Secondary | ICD-10-CM | POA: Diagnosis not present

## 2018-05-22 DIAGNOSIS — E119 Type 2 diabetes mellitus without complications: Secondary | ICD-10-CM | POA: Diagnosis not present

## 2018-07-06 DIAGNOSIS — E119 Type 2 diabetes mellitus without complications: Secondary | ICD-10-CM | POA: Diagnosis not present

## 2018-07-06 DIAGNOSIS — E611 Iron deficiency: Secondary | ICD-10-CM | POA: Diagnosis not present

## 2018-09-21 DIAGNOSIS — Z01419 Encounter for gynecological examination (general) (routine) without abnormal findings: Secondary | ICD-10-CM | POA: Diagnosis not present

## 2018-09-21 DIAGNOSIS — Z6830 Body mass index (BMI) 30.0-30.9, adult: Secondary | ICD-10-CM | POA: Diagnosis not present

## 2018-09-21 DIAGNOSIS — Z130 Encounter for screening for diseases of the blood and blood-forming organs and certain disorders involving the immune mechanism: Secondary | ICD-10-CM | POA: Diagnosis not present

## 2018-09-21 DIAGNOSIS — Z1231 Encounter for screening mammogram for malignant neoplasm of breast: Secondary | ICD-10-CM | POA: Diagnosis not present

## 2018-09-21 DIAGNOSIS — Z1389 Encounter for screening for other disorder: Secondary | ICD-10-CM | POA: Diagnosis not present

## 2018-10-06 DIAGNOSIS — N920 Excessive and frequent menstruation with regular cycle: Secondary | ICD-10-CM | POA: Diagnosis not present

## 2018-10-06 DIAGNOSIS — N879 Dysplasia of cervix uteri, unspecified: Secondary | ICD-10-CM | POA: Diagnosis not present

## 2018-10-06 DIAGNOSIS — E119 Type 2 diabetes mellitus without complications: Secondary | ICD-10-CM | POA: Diagnosis not present

## 2018-11-02 DIAGNOSIS — N920 Excessive and frequent menstruation with regular cycle: Secondary | ICD-10-CM | POA: Diagnosis not present

## 2018-11-20 DIAGNOSIS — Z0000 Encounter for general adult medical examination without abnormal findings: Secondary | ICD-10-CM | POA: Diagnosis not present

## 2018-11-20 DIAGNOSIS — E7849 Other hyperlipidemia: Secondary | ICD-10-CM | POA: Diagnosis not present

## 2018-11-20 DIAGNOSIS — E119 Type 2 diabetes mellitus without complications: Secondary | ICD-10-CM | POA: Diagnosis not present

## 2018-11-27 DIAGNOSIS — E611 Iron deficiency: Secondary | ICD-10-CM | POA: Diagnosis not present

## 2018-11-27 DIAGNOSIS — E785 Hyperlipidemia, unspecified: Secondary | ICD-10-CM | POA: Diagnosis not present

## 2018-11-27 DIAGNOSIS — R82998 Other abnormal findings in urine: Secondary | ICD-10-CM | POA: Diagnosis not present

## 2018-11-27 DIAGNOSIS — Z0000 Encounter for general adult medical examination without abnormal findings: Secondary | ICD-10-CM | POA: Diagnosis not present

## 2018-11-27 DIAGNOSIS — E119 Type 2 diabetes mellitus without complications: Secondary | ICD-10-CM | POA: Diagnosis not present

## 2018-11-27 DIAGNOSIS — Z23 Encounter for immunization: Secondary | ICD-10-CM | POA: Diagnosis not present

## 2018-11-27 DIAGNOSIS — Z1331 Encounter for screening for depression: Secondary | ICD-10-CM | POA: Diagnosis not present

## 2018-11-27 DIAGNOSIS — I10 Essential (primary) hypertension: Secondary | ICD-10-CM | POA: Diagnosis not present

## 2018-11-30 DIAGNOSIS — Z1212 Encounter for screening for malignant neoplasm of rectum: Secondary | ICD-10-CM | POA: Diagnosis not present

## 2019-01-11 ENCOUNTER — Other Ambulatory Visit: Payer: Self-pay

## 2019-01-11 DIAGNOSIS — Z20822 Contact with and (suspected) exposure to covid-19: Secondary | ICD-10-CM

## 2019-01-14 LAB — NOVEL CORONAVIRUS, NAA: SARS-CoV-2, NAA: NOT DETECTED

## 2019-05-22 ENCOUNTER — Other Ambulatory Visit: Payer: Self-pay

## 2019-05-22 ENCOUNTER — Emergency Department (HOSPITAL_COMMUNITY)
Admission: EM | Admit: 2019-05-22 | Discharge: 2019-05-22 | Discharge status: Against Medical Advice | Payer: 59 | Attending: Emergency Medicine | Admitting: Emergency Medicine

## 2019-05-22 ENCOUNTER — Encounter (HOSPITAL_COMMUNITY): Payer: Self-pay | Admitting: Emergency Medicine

## 2019-05-22 DIAGNOSIS — Z5321 Procedure and treatment not carried out due to patient leaving prior to being seen by health care provider: Secondary | ICD-10-CM | POA: Insufficient documentation

## 2019-05-22 DIAGNOSIS — R109 Unspecified abdominal pain: Secondary | ICD-10-CM | POA: Insufficient documentation

## 2019-05-22 LAB — COMPREHENSIVE METABOLIC PANEL WITH GFR
ALT: 25 U/L (ref 0–44)
AST: 17 U/L (ref 15–41)
Albumin: 4.1 g/dL (ref 3.5–5.0)
Alkaline Phosphatase: 58 U/L (ref 38–126)
Anion gap: 13 (ref 5–15)
BUN: 14 mg/dL (ref 6–20)
CO2: 24 mmol/L (ref 22–32)
Calcium: 9.2 mg/dL (ref 8.9–10.3)
Chloride: 102 mmol/L (ref 98–111)
Creatinine, Ser: 0.75 mg/dL (ref 0.44–1.00)
GFR calc Af Amer: >60 mL/min
GFR calc non Af Amer: >60 mL/min
Glucose, Bld: 103 mg/dL — ABNORMAL HIGH (ref 70–99)
Potassium: 3.5 mmol/L (ref 3.5–5.1)
Sodium: 139 mmol/L (ref 135–145)
Total Bilirubin: 0.7 mg/dL (ref 0.3–1.2)
Total Protein: 7.1 g/dL (ref 6.5–8.1)

## 2019-05-22 LAB — CBC
HCT: 42.6 % (ref 36.0–46.0)
Hemoglobin: 14.1 g/dL (ref 12.0–15.0)
MCH: 29.7 pg (ref 26.0–34.0)
MCHC: 33.1 g/dL (ref 30.0–36.0)
MCV: 89.7 fL (ref 80.0–100.0)
Platelets: 295 10*3/uL (ref 150–400)
RBC: 4.75 MIL/uL (ref 3.87–5.11)
RDW: 12.3 % (ref 11.5–15.5)
WBC: 8.5 10*3/uL (ref 4.0–10.5)
nRBC: 0 % (ref 0.0–0.2)

## 2019-05-22 LAB — URINALYSIS, ROUTINE W REFLEX MICROSCOPIC
Bilirubin Urine: NEGATIVE
Glucose, UA: NEGATIVE mg/dL
Ketones, ur: 5 mg/dL — AB
Leukocytes,Ua: NEGATIVE
Nitrite: NEGATIVE
Protein, ur: NEGATIVE mg/dL
Specific Gravity, Urine: 1.019 (ref 1.005–1.030)
pH: 5 (ref 5.0–8.0)

## 2019-05-22 LAB — I-STAT BETA HCG BLOOD, ED (MC, WL, AP ONLY): I-stat hCG, quantitative: <5 m[IU]/mL (ref <5)

## 2019-05-22 LAB — LIPASE, BLOOD: Lipase: 27 U/L (ref 11–51)

## 2019-05-22 NOTE — ED Notes (Signed)
Pt stated to registration that she was not waiting

## 2019-05-22 NOTE — ED Triage Notes (Signed)
Pt reports sudden abd pain at 330 today. Endorses diarrhea, throat swelling and arm tingling.

## 2019-08-14 IMAGING — MG DIGITAL DIAGNOSTIC UNILATERAL LEFT MAMMOGRAM WITH TOMO AND CAD
4 series · 4 of 12 positions shown · non-contrast
Comparison: Previous exam(s).

CLINICAL DATA: 47-year-old female recalled from screening mammogram
dated 09/16/2017 for a possible left breast asymmetry.

EXAM:
DIGITAL DIAGNOSTIC UNILATERAL LEFT MAMMOGRAM WITH CAD AND TOMO

[L MLO synth-2D]
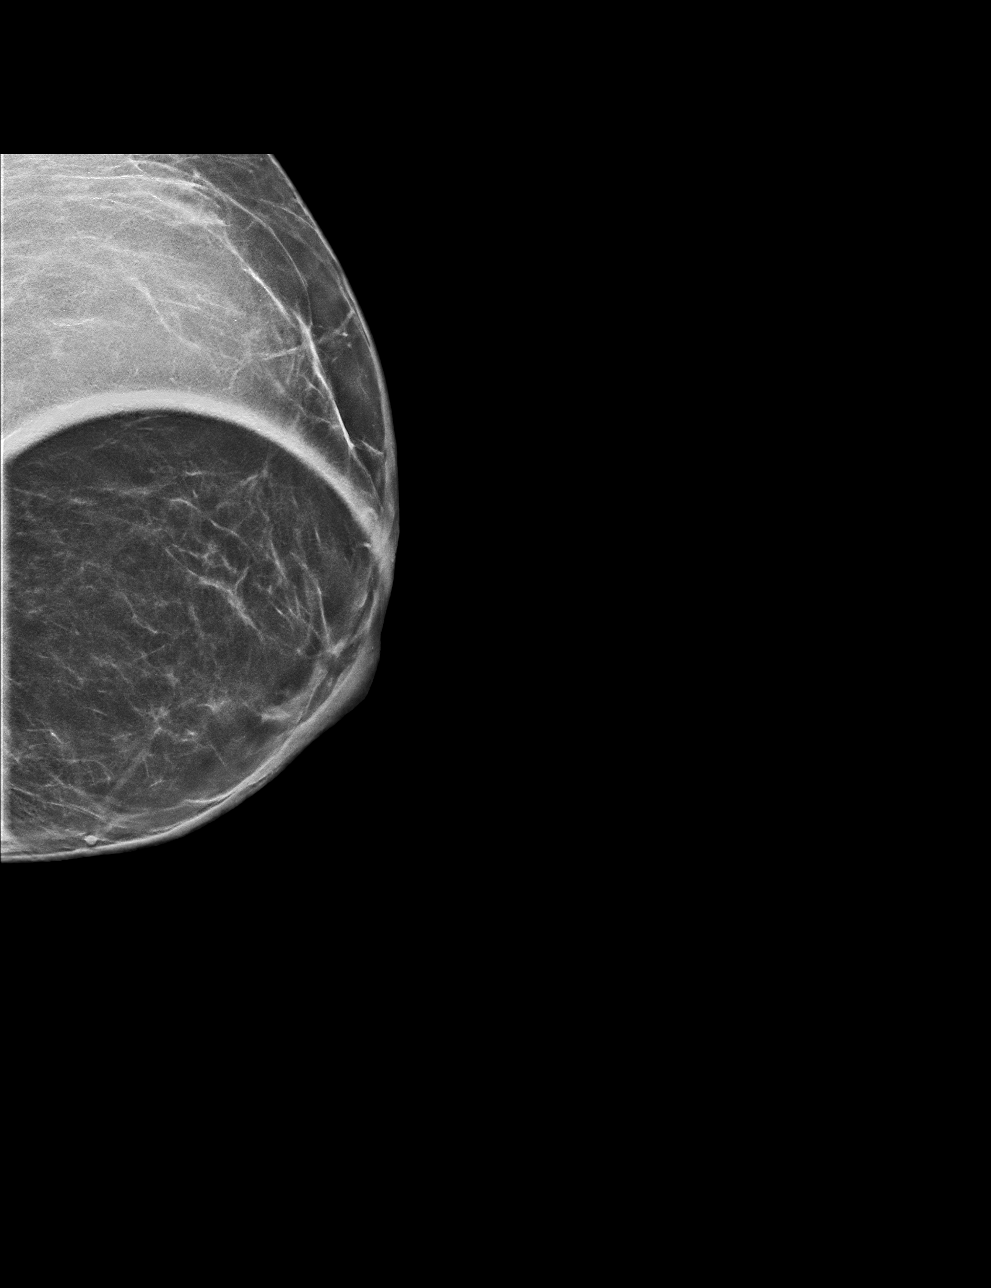

[L CC synth-2D]
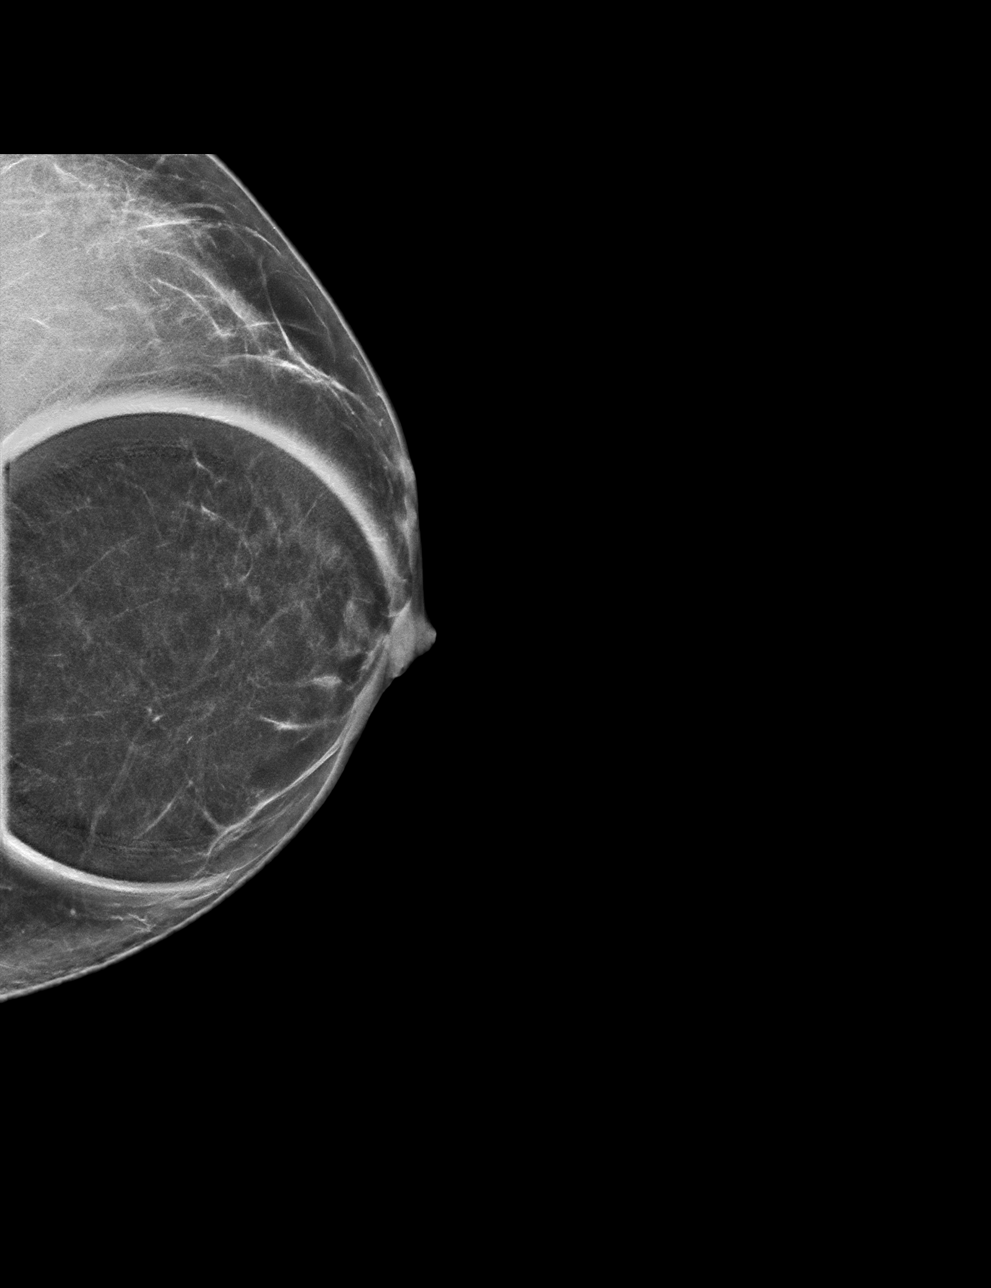

[L MLO tomo · tomo slice 29/56.0]
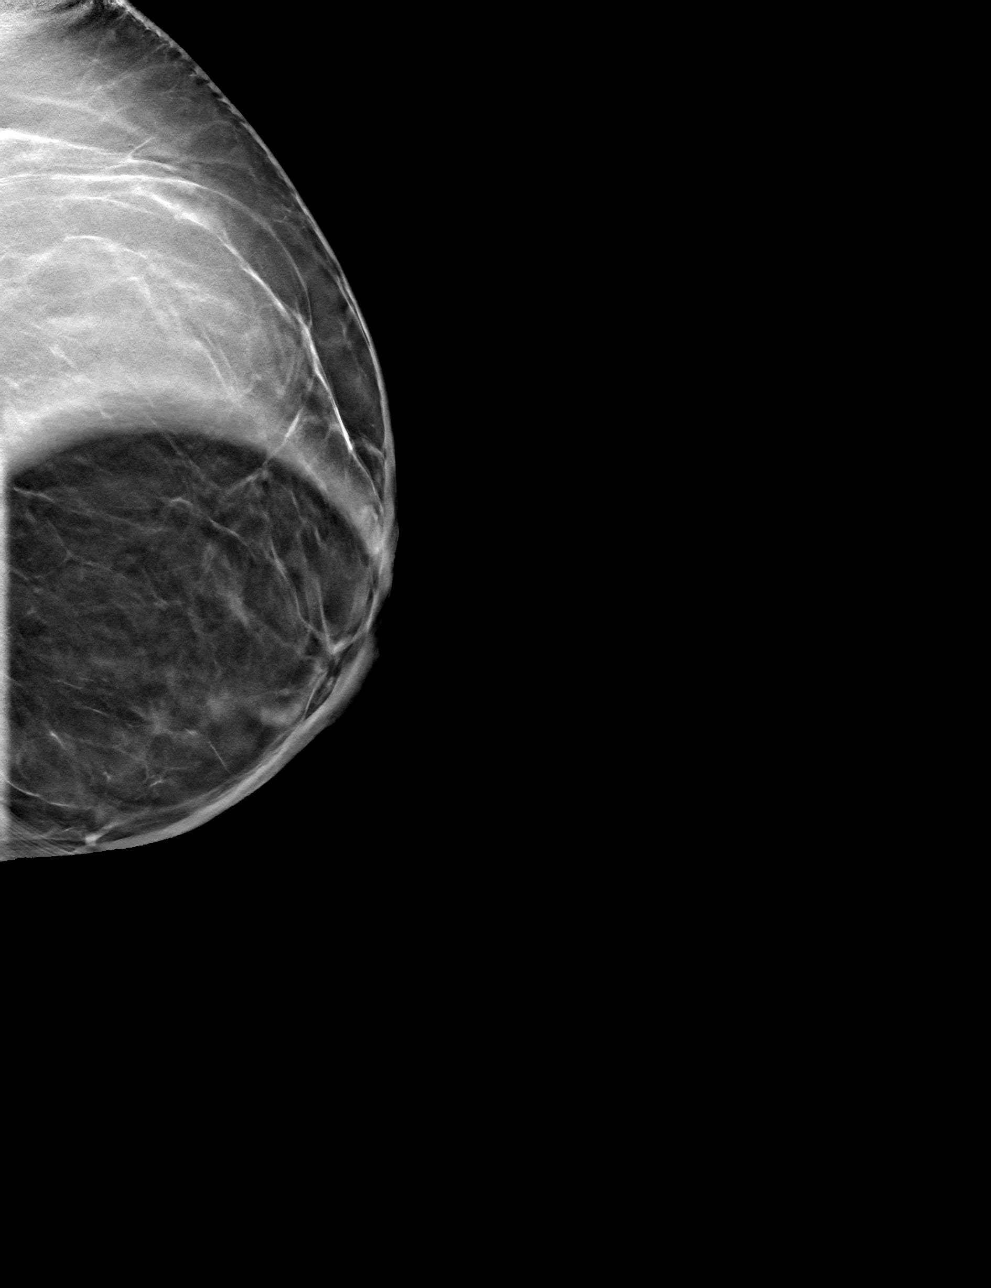

[L CC tomo · tomo slice 27/53.0]
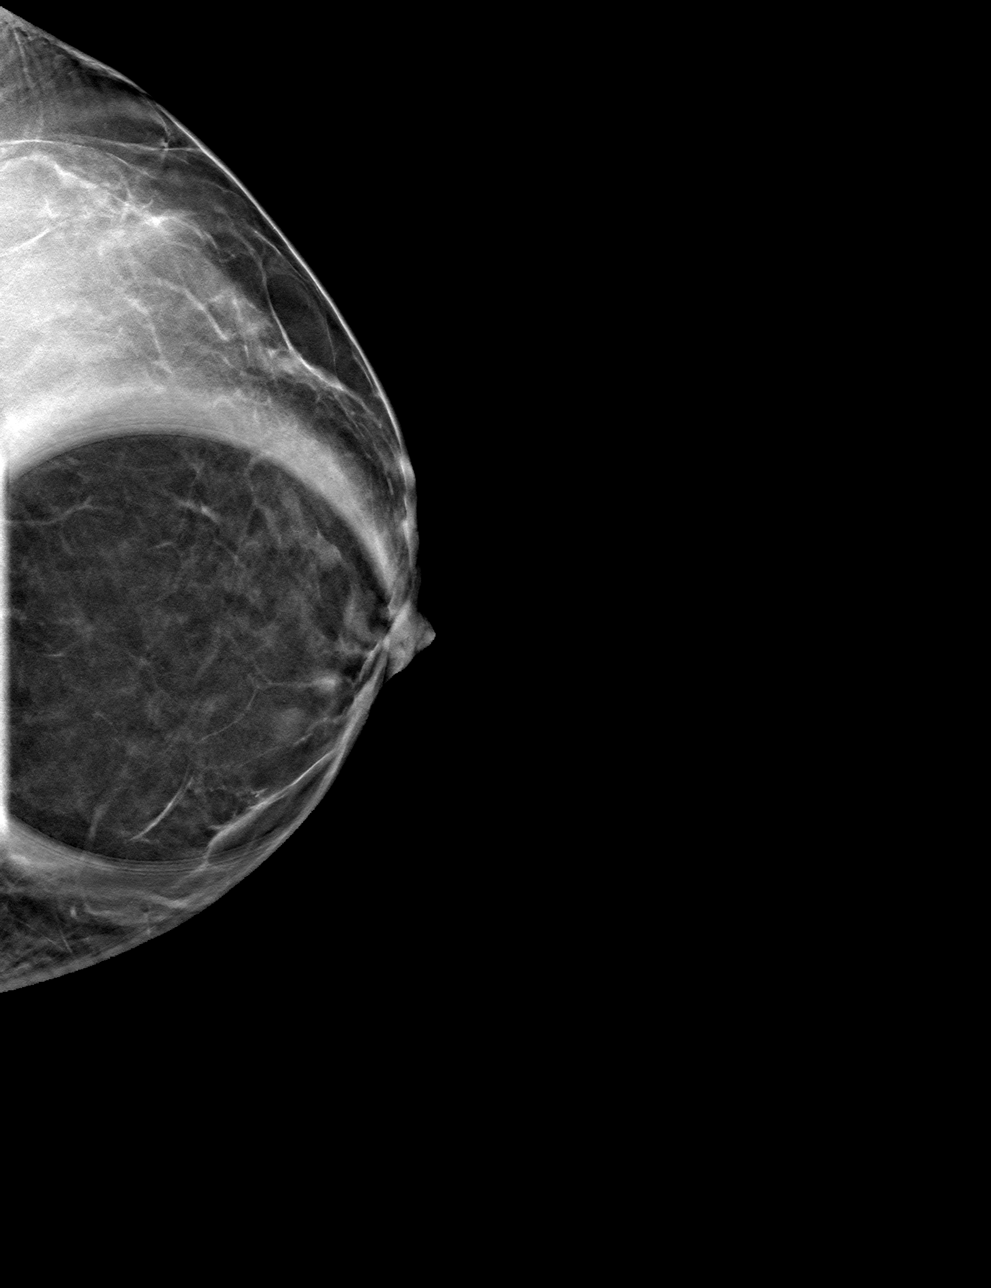

[4 of 12 positions shown; findings below may reference images not displayed]

ACR Breast Density Category b: There are scattered areas of
fibroglandular density.
FINDINGS: The previously described, possible asymmetry in the inferior medial
left breast anterior depth resolves into well dispersed
fibroglandular tissue on today's additional views. No suspicious
mammographic findings are identified.

Mammographic images were processed with CAD.
IMPRESSION: No mammographic evidence of malignancy.

RECOMMENDATION:
Screening mammogram in one year.(Code:0T-[id number])

I have discussed the findings and recommendations with the patient.
Results were also provided in writing at the conclusion of the
visit. If applicable, a reminder letter will be sent to the patient
regarding the next appointment.

BI-RADS CATEGORY  1: Negative.

## 2019-10-03 ENCOUNTER — Other Ambulatory Visit: Payer: Self-pay | Admitting: Obstetrics and Gynecology

## 2019-10-03 DIAGNOSIS — R928 Other abnormal and inconclusive findings on diagnostic imaging of breast: Secondary | ICD-10-CM

## 2019-10-17 ENCOUNTER — Ambulatory Visit
Admission: RE | Admit: 2019-10-17 | Discharge: 2019-10-17 | Disposition: A | Discharge status: Home / Self Care | Payer: No Typology Code available for payment source | Source: Ambulatory Visit | Attending: Obstetrics and Gynecology | Admitting: Obstetrics and Gynecology

## 2019-10-17 ENCOUNTER — Ambulatory Visit: Payer: 59

## 2019-10-17 ENCOUNTER — Other Ambulatory Visit: Payer: Self-pay

## 2019-10-17 DIAGNOSIS — R928 Other abnormal and inconclusive findings on diagnostic imaging of breast: Secondary | ICD-10-CM

## 2020-12-29 ENCOUNTER — Other Ambulatory Visit: Payer: Self-pay | Admitting: Internal Medicine

## 2020-12-29 DIAGNOSIS — F17201 Nicotine dependence, unspecified, in remission: Secondary | ICD-10-CM

## 2021-01-20 ENCOUNTER — Ambulatory Visit
Admission: RE | Admit: 2021-01-20 | Discharge: 2021-01-20 | Disposition: A | Discharge status: Home / Self Care | Payer: No Typology Code available for payment source | Source: Ambulatory Visit | Attending: Internal Medicine | Admitting: Internal Medicine

## 2021-01-20 DIAGNOSIS — F17201 Nicotine dependence, unspecified, in remission: Secondary | ICD-10-CM

## 2021-12-04 ENCOUNTER — Other Ambulatory Visit (HOSPITAL_COMMUNITY): Payer: Self-pay

## 2021-12-04 MED ORDER — MOUNJARO 10 MG/0.5ML ~~LOC~~ SOAJ
10.0000 mg | SUBCUTANEOUS | 0 refills | Status: AC
  Filled 2021-12-04 – 2021-12-31 (×2): qty 2, 28d supply, fill #0

## 2021-12-04 MED ORDER — MOUNJARO 7.5 MG/0.5ML ~~LOC~~ SOAJ
7.5000 mg | SUBCUTANEOUS | 0 refills | Status: AC
  Filled 2021-12-04: qty 2, 28d supply, fill #0

## 2021-12-07 ENCOUNTER — Other Ambulatory Visit (HOSPITAL_COMMUNITY): Payer: Self-pay

## 2021-12-08 ENCOUNTER — Other Ambulatory Visit (HOSPITAL_COMMUNITY): Payer: Self-pay

## 2021-12-31 ENCOUNTER — Other Ambulatory Visit (HOSPITAL_COMMUNITY): Payer: Self-pay

## 2022-01-06 ENCOUNTER — Other Ambulatory Visit: Payer: Self-pay | Admitting: Internal Medicine

## 2022-01-06 DIAGNOSIS — F17201 Nicotine dependence, unspecified, in remission: Secondary | ICD-10-CM

## 2022-01-27 ENCOUNTER — Other Ambulatory Visit (HOSPITAL_COMMUNITY): Payer: Self-pay

## 2022-01-27 MED ORDER — MOUNJARO 12.5 MG/0.5ML ~~LOC~~ SOAJ
12.5000 mg | SUBCUTANEOUS | 0 refills | Status: DC
  Filled 2022-01-27: qty 2, 28d supply, fill #0

## 2022-02-26 ENCOUNTER — Other Ambulatory Visit (HOSPITAL_COMMUNITY): Payer: Self-pay

## 2022-02-26 MED ORDER — MOUNJARO 12.5 MG/0.5ML ~~LOC~~ SOAJ
SUBCUTANEOUS | 0 refills | Status: AC
  Filled 2022-02-26: qty 2, 28d supply, fill #0

## 2022-02-26 MED ORDER — MOUNJARO 12.5 MG/0.5ML ~~LOC~~ SOAJ
12.5000 mg | SUBCUTANEOUS | 4 refills | Status: AC
  Filled 2022-02-26: qty 2, 28d supply, fill #0

## 2022-03-16 ENCOUNTER — Other Ambulatory Visit (HOSPITAL_COMMUNITY): Payer: Self-pay

## 2022-04-05 ENCOUNTER — Ambulatory Visit
Admission: RE | Admit: 2022-04-05 | Discharge: 2022-04-05 | Disposition: A | Discharge status: Home / Self Care | Payer: No Typology Code available for payment source | Source: Ambulatory Visit | Attending: Internal Medicine | Admitting: Internal Medicine

## 2022-04-05 DIAGNOSIS — F17201 Nicotine dependence, unspecified, in remission: Secondary | ICD-10-CM

## 2022-07-28 ENCOUNTER — Other Ambulatory Visit (HOSPITAL_COMMUNITY): Payer: Self-pay

## 2022-07-28 MED ORDER — MOUNJARO 12.5 MG/0.5ML ~~LOC~~ SOAJ
12.5000 mg | SUBCUTANEOUS | 3 refills | Status: DC
Start: 2022-07-28 — End: 2023-08-04
  Filled 2022-07-28: qty 2, 28d supply, fill #0
  Filled 2022-09-23: qty 2, 28d supply, fill #1
  Filled 2022-10-28: qty 2, 28d supply, fill #2
  Filled 2022-11-19: qty 2, 28d supply, fill #3
  Filled 2022-12-23: qty 2, 28d supply, fill #4
  Filled 2023-01-17: qty 2, 28d supply, fill #5
  Filled 2023-02-10: qty 2, 28d supply, fill #6
  Filled 2023-03-15: qty 2, 28d supply, fill #7
  Filled 2023-04-12: qty 2, 28d supply, fill #8
  Filled 2023-05-12: qty 2, 28d supply, fill #9
  Filled 2023-06-08: qty 2, 28d supply, fill #10
  Filled 2023-07-07: qty 2, 28d supply, fill #11

## 2022-09-23 ENCOUNTER — Other Ambulatory Visit (HOSPITAL_COMMUNITY): Payer: Self-pay

## 2022-10-29 ENCOUNTER — Other Ambulatory Visit (HOSPITAL_COMMUNITY): Payer: Self-pay

## 2023-01-17 ENCOUNTER — Other Ambulatory Visit: Payer: Self-pay

## 2023-02-11 ENCOUNTER — Other Ambulatory Visit (HOSPITAL_COMMUNITY): Payer: Self-pay

## 2023-03-16 ENCOUNTER — Other Ambulatory Visit (HOSPITAL_COMMUNITY): Payer: Self-pay

## 2023-04-05 ENCOUNTER — Other Ambulatory Visit: Payer: Self-pay | Admitting: Internal Medicine

## 2023-04-05 DIAGNOSIS — F17201 Nicotine dependence, unspecified, in remission: Secondary | ICD-10-CM

## 2023-04-12 ENCOUNTER — Other Ambulatory Visit (HOSPITAL_COMMUNITY): Payer: Self-pay

## 2023-06-01 ENCOUNTER — Other Ambulatory Visit

## 2023-06-10 ENCOUNTER — Ambulatory Visit
Admission: RE | Admit: 2023-06-10 | Discharge: 2023-06-10 | Disposition: A | Discharge status: Home / Self Care | Source: Ambulatory Visit | Attending: Internal Medicine | Admitting: Internal Medicine

## 2023-06-10 DIAGNOSIS — F17201 Nicotine dependence, unspecified, in remission: Secondary | ICD-10-CM

## 2023-08-04 ENCOUNTER — Other Ambulatory Visit (HOSPITAL_COMMUNITY): Payer: Self-pay

## 2023-08-04 MED ORDER — MOUNJARO 12.5 MG/0.5ML ~~LOC~~ SOAJ
12.5000 mg | SUBCUTANEOUS | 3 refills | Status: AC
  Filled 2023-08-04: qty 2, 28d supply, fill #0
  Filled 2023-08-30: qty 2, 28d supply, fill #1
  Filled 2023-09-28: qty 2, 28d supply, fill #2
  Filled 2023-10-26: qty 2, 28d supply, fill #3
  Filled 2023-11-21: qty 2, 28d supply, fill #4
  Filled 2023-12-18: qty 2, 28d supply, fill #5
  Filled 2024-01-15: qty 2, 28d supply, fill #6
  Filled 2024-02-07: qty 2, 28d supply, fill #7
  Filled 2024-03-19 – 2024-03-20 (×2): qty 2, 28d supply, fill #8

## 2023-08-30 ENCOUNTER — Other Ambulatory Visit (HOSPITAL_COMMUNITY): Payer: Self-pay

## 2023-09-01 ENCOUNTER — Other Ambulatory Visit (HOSPITAL_COMMUNITY): Payer: Self-pay

## 2023-12-19 ENCOUNTER — Other Ambulatory Visit (HOSPITAL_COMMUNITY): Payer: Self-pay

## 2024-01-16 ENCOUNTER — Other Ambulatory Visit (HOSPITAL_COMMUNITY): Payer: Self-pay

## 2024-01-16 MED ORDER — MOUNJARO 12.5 MG/0.5ML ~~LOC~~ SOAJ
12.5000 mg | SUBCUTANEOUS | 0 refills | Status: AC
  Filled 2024-01-16 – 2024-02-19 (×2): qty 2, 28d supply, fill #0

## 2024-02-07 ENCOUNTER — Other Ambulatory Visit (HOSPITAL_COMMUNITY): Payer: Self-pay

## 2024-02-20 ENCOUNTER — Other Ambulatory Visit (HOSPITAL_COMMUNITY): Payer: Self-pay

## 2024-02-22 ENCOUNTER — Other Ambulatory Visit (HOSPITAL_COMMUNITY): Payer: Self-pay

## 2024-03-20 ENCOUNTER — Other Ambulatory Visit (HOSPITAL_COMMUNITY): Payer: Self-pay
